# Patient Record
Sex: Female | Born: 1950 | Race: White | Hispanic: No | Marital: Married | Smoking: Former smoker
Health system: Southern US, Community
[De-identification: ages and names within clinical notes are randomized; demographics above are authoritative.]

## PROBLEM LIST (undated history)

## (undated) DIAGNOSIS — R51 Headache: Secondary | ICD-10-CM

## (undated) DIAGNOSIS — R519 Headache, unspecified: Secondary | ICD-10-CM

## (undated) DIAGNOSIS — J449 Chronic obstructive pulmonary disease, unspecified: Secondary | ICD-10-CM

## (undated) HISTORY — PX: TUBAL LIGATION: SHX77

## (undated) HISTORY — PX: CHOLECYSTECTOMY: SHX55

---

## 1997-09-10 ENCOUNTER — Emergency Department (HOSPITAL_COMMUNITY): Admission: EM | Admit: 1997-09-10 | Discharge: 1997-09-10 | Payer: Self-pay | Admitting: Emergency Medicine

## 1998-07-28 ENCOUNTER — Encounter: Payer: Self-pay | Admitting: Obstetrics and Gynecology

## 1998-07-28 ENCOUNTER — Ambulatory Visit (HOSPITAL_COMMUNITY): Admission: RE | Admit: 1998-07-28 | Discharge: 1998-07-28 | Payer: Self-pay | Admitting: *Deleted

## 2004-03-04 ENCOUNTER — Ambulatory Visit (HOSPITAL_COMMUNITY): Admission: RE | Admit: 2004-03-04 | Discharge: 2004-03-04 | Payer: Self-pay | Admitting: Obstetrics and Gynecology

## 2004-09-22 ENCOUNTER — Emergency Department (HOSPITAL_COMMUNITY): Admission: EM | Admit: 2004-09-22 | Discharge: 2004-09-22 | Payer: Self-pay | Admitting: Family Medicine

## 2005-05-11 ENCOUNTER — Ambulatory Visit (HOSPITAL_COMMUNITY): Admission: RE | Admit: 2005-05-11 | Discharge: 2005-05-11 | Payer: Self-pay | Admitting: Obstetrics and Gynecology

## 2005-07-31 ENCOUNTER — Ambulatory Visit (HOSPITAL_COMMUNITY): Admission: RE | Admit: 2005-07-31 | Discharge: 2005-07-31 | Payer: Self-pay | Admitting: Cardiology

## 2005-08-14 ENCOUNTER — Encounter: Admission: RE | Admit: 2005-08-14 | Discharge: 2005-08-14 | Payer: Self-pay | Admitting: Cardiology

## 2005-12-21 ENCOUNTER — Encounter: Admission: RE | Admit: 2005-12-21 | Discharge: 2005-12-21 | Payer: Self-pay | Admitting: Family Medicine

## 2009-02-03 ENCOUNTER — Ambulatory Visit (HOSPITAL_COMMUNITY): Admission: RE | Admit: 2009-02-03 | Discharge: 2009-02-03 | Payer: Self-pay | Admitting: Obstetrics and Gynecology

## 2010-09-16 NOTE — Cardiovascular Report (Signed)
[name], [name]                 ACCOUNT NO.:  [id number]   MEDICAL RECORD NO.:  [id number]          PATIENT TYPE:  OIB   LOCATION:  2899                         FACILITY:  [facility]   PHYSICIAN:  Armanda Magic, M.D.     DATE OF BIRTH:  1950-12-13   DATE OF PROCEDURE:  DATE OF DISCHARGE:  07/31/2005                              CARDIAC CATHETERIZATION   REFERRING PHYSICIAN:  Dr. Lupe Carney.   PROCEDURES:  Left heart catheterization, coronary angiography, left  ventriculography.   OPERATOR:  Armanda Magic, M.D.   COMPLICATIONS:  None.   IV ACCESS:  The right femoral artery 6-French sheath.   INDICATIONS:  Chest pain.   This is a very pleasant 60 year old white female who presents with symptoms  of chest pain.  She does have a history of significant tobacco abuse and now  presents for cardiac catheterization, after undergoing a stress Cardiolite  study which showed some decreased uptake in the anterior wall and a fixed  apical defect, also some decreased perfusion in basal septum, more prominent  on stress than on rest images.   The patient was brought to cardiac catheterization laboratory in a fasting,  non-sedated state.  Informed consent was obtained.  The patient was  connected to continuous heart rate and pulse oximetry monitoring and  intermittent blood pressure monitor.  The right groin was prepped and draped  in a sterile fashion.  1% Xylocaine was used for local anesthesia.  Using  the modified Seldinger technique, a 6-French sheath was placed in right  femoral artery.  Under fluoroscopic guidance, a 6-French JL-4 catheter was  placed in the left coronary artery.  Multiple cine films were taken at the  30 degree RAO and 40 degree LAO views.  This catheter was then exchanged out  of her guidewire for a 6-French JR-4 catheter which was placed under  fluoroscopic guidance in the right coronary artery.  Multiple cine films  were taken at 30 degree RAO and 40 degree LAO  views.  This catheter was then  exchanged out of the guidewire for a 6-French angled pigtail catheter, which  was placed under fluoroscopic guidance to the left ventricular cavity.  Left  ventriculography was performed in the 30 degree RAO view using a total of 30  cc of contrast at 15 cc per second.  Catheter was then pulled back across  the aortic valve with no significant gradient noted.  At the end of the  procedure, all catheters and sheaths were removed.  Manual compression was  performed until adequate hemostasis was obtained.  The patient was  transferred back to her room in stable condition.   RESULTS:  1.  The left main coronary is widely patent and bifurcates in the left      anterior descending artery and left circumflex artery.  The left      anterior descending artery is widely patent throughout its course with      some mild systolic compression of the mid LAD.  The LAD gives off a      large diagonal branch which is widely  patent and bifurcates distally      into 2 daughter branches, both of which are widely patent.   The left circumflex is widely patent throughout its course giving rise to 5  obtuse marginal branches, all of which are widely patent.   The right coronary is widely patent and bifurcates into a posterior  descending artery and posterior lateral artery, both of which are widely  patent.   Left ventriculography shows a low normal LV systolic function, EF 50%, LV  pressure 131/10 mmHg, aortic pressure 132/70 mmHg, LVEDP 13 mmHg.   ASSESSMENT:  1.  Noncardiac chest pain.  2.  Normal coronary arteries.  3.  Low normal left ventricular function.   PLAN:  Discharge to home after IV fluid and bed rest.  Follow up with me in  2 weeks.  Outpatient chest CT with contrast to rule out PEs, and Nexium 40  mg a day.      Armanda Magic, M.D.  Electronically Signed     TT/MEDQ  D:  08/14/2005  T:  08/15/2005  Job:  [id number]   cc:   L. Lupe Carney, M.D.   Fax: [phone]

## 2010-09-16 NOTE — Cardiovascular Report (Signed)
[name], [name]                 ACCOUNT NO.:  [id number]   MEDICAL RECORD NO.:  [id number]          PATIENT TYPE:  OIB   LOCATION:  2899                         FACILITY:  [facility]   PHYSICIAN:  Armanda Magic, M.D.     DATE OF BIRTH:  [birth year]   DATE OF PROCEDURE:  07/31/2005  DATE OF DISCHARGE:  07/31/2005                              CARDIAC CATHETERIZATION   REFERRING PHYSICIAN:  L. Lupe Carney, M.D.   PROCEDURE:  Left heart catheterization, coronary angiography, left  ventriculography.   SURGEON:  Armanda Magic, M.D.   COMPLICATIONS:  None.   IV ACCESS:  Via right femoral artery 6-French sheath, __________.   This is a 60 year old white female with occasional chest pain who now  presents for cardiac catheterization.   The patient is brought to cardiac catheterization laboratory in fasting  nonsedated state.  Informed consent was obtained.  The patient was connected  to continuous heart rate and pulse oximetry monitoring and blood pressure  monitoring.  The right groin was prepped and draped in sterile fashion.  We  used 1% Xylocaine for local anesthesia.  Using modified Seldinger technique,  a 6-French sheath was placed in right femoral artery.  Under fluoroscopic  guidance, a 6-French JL-4 catheter was placed in the left coronary artery.  Multiple cine films were taken at 30 degree RAO and 40 LAO views.  This  catheter was exchanged out over a guidewire for a 6-French JR-4 catheter  which was placed under fluoroscopic guidance in the right coronary artery.  Multiple cine films were taken at 30 degree RAO and 40 degree LAO views.  This catheter was exchanged out over a guidewire for 6-French angled pigtail  catheter which was placed under fluoroscopic guidance in the left  ventricular cavity.  Left ventriculography was performed in the 30 degree  RAO view using total of 30 mL of contrast at 15 mL per second.  Catheter was  then pulled back across the aortic valve with no  significant gradient noted.  At the end of the procedure, all catheters and sheaths were removed.  Manual  compression was performed until adequate hemostasis was obtained.  The  patient transferred back to the room in stable condition.   RESULTS:  The left main coronary artery is widely patent and bifurcates into  left anterior descending artery and left circumflex artery.   Left anterior descending artery is widely patent throughout its course to  the apex with mild systolic compression in the mid portion of the vessel.  It gives rise to first diagonal branch which is widely patent and bifurcates  into daughter branches both of which are widely patent.   The left circumflex is widely patent throughout its course in the AV groove,  giving rise to five obtuse marginal branches all of which are widely patent.   The right coronary artery is widely patent throughout its course and  distally bifurcates into posterior descending artery and posterior lateral  artery both of which are widely patent.   Left ventriculography shows low normal LV systolic function  EF 50%, left  ventricular pressure 131/10 mmHg, aortic pressure 132/70 mmHg, LVEDP 13  mmHg.   ASSESSMENT:  1.  Noncardiac chest pain.  2.  Normal coronary arteries.  3.  Low normal left ventricular function.   PLAN:  Discharge to home after IV fluid, bedrest.  Follow up with me in 2  weeks.  Outpatient chest CT with contrast to rule out PE, GI workup as an  outpatient.  Nexium 40 mg a day.      Armanda Magic, M.D.  Electronically Signed     TT/MEDQ  D:  08/31/2005  T:  09/01/2005  Job:  [id number]   cc:   L. Lupe Carney, M.D.  Fax: [phone]

## 2012-01-17 ENCOUNTER — Other Ambulatory Visit (HOSPITAL_COMMUNITY): Payer: Self-pay | Admitting: Obstetrics and Gynecology

## 2012-01-17 DIAGNOSIS — Z1231 Encounter for screening mammogram for malignant neoplasm of breast: Secondary | ICD-10-CM

## 2012-01-22 ENCOUNTER — Ambulatory Visit (HOSPITAL_COMMUNITY)
Admission: RE | Admit: 2012-01-22 | Discharge: 2012-01-22 | Disposition: A | Discharge status: Home / Self Care | Payer: 59 | Source: Ambulatory Visit | Attending: Obstetrics and Gynecology | Admitting: Obstetrics and Gynecology

## 2012-01-22 DIAGNOSIS — Z1231 Encounter for screening mammogram for malignant neoplasm of breast: Secondary | ICD-10-CM | POA: Insufficient documentation

## 2012-05-10 ENCOUNTER — Other Ambulatory Visit: Payer: Self-pay | Admitting: Family Medicine

## 2012-05-10 ENCOUNTER — Other Ambulatory Visit (HOSPITAL_COMMUNITY)
Admission: RE | Admit: 2012-05-10 | Discharge: 2012-05-10 | Disposition: A | Discharge status: Home / Self Care | Payer: 59 | Source: Ambulatory Visit | Attending: Family Medicine | Admitting: Family Medicine

## 2012-05-10 DIAGNOSIS — Z124 Encounter for screening for malignant neoplasm of cervix: Secondary | ICD-10-CM | POA: Insufficient documentation

## 2012-06-27 ENCOUNTER — Other Ambulatory Visit: Payer: Self-pay | Admitting: Gastroenterology

## 2014-08-21 ENCOUNTER — Other Ambulatory Visit: Payer: Self-pay | Admitting: Family Medicine

## 2014-08-21 ENCOUNTER — Other Ambulatory Visit (HOSPITAL_COMMUNITY): Payer: Self-pay | Admitting: Radiology

## 2014-08-21 ENCOUNTER — Ambulatory Visit
Admission: RE | Admit: 2014-08-21 | Discharge: 2014-08-21 | Disposition: A | Discharge status: Home / Self Care | Payer: Medicare Other | Source: Ambulatory Visit | Attending: Family Medicine | Admitting: Family Medicine

## 2014-08-21 DIAGNOSIS — J439 Emphysema, unspecified: Secondary | ICD-10-CM

## 2014-08-21 DIAGNOSIS — J449 Chronic obstructive pulmonary disease, unspecified: Secondary | ICD-10-CM

## 2014-09-01 ENCOUNTER — Ambulatory Visit (HOSPITAL_COMMUNITY)
Admission: RE | Admit: 2014-09-01 | Discharge: 2014-09-01 | Disposition: A | Discharge status: Home / Self Care | Payer: Medicare Other | Source: Ambulatory Visit | Attending: Family Medicine | Admitting: Family Medicine

## 2014-09-01 DIAGNOSIS — J449 Chronic obstructive pulmonary disease, unspecified: Secondary | ICD-10-CM | POA: Diagnosis present

## 2014-09-01 LAB — PULMONARY FUNCTION TEST
DL/VA % PRED: 68 %
DL/VA: 3.35 ml/min/mmHg/L
DLCO UNC % PRED: 25 %
DLCO UNC: 6.62 ml/min/mmHg
FEF 25-75 POST: 0.62 L/s
FEF 25-75 PRE: 0.49 L/s
FEF2575-%Change-Post: 25 %
FEF2575-%PRED-PRE: 22 %
FEF2575-%Pred-Post: 28 %
FEV1-%Change-Post: 10 %
FEV1-%PRED-PRE: 43 %
FEV1-%Pred-Post: 47 %
FEV1-POST: 1.19 L
FEV1-Pre: 1.08 L
FEV1FVC-%CHANGE-POST: -2 %
FEV1FVC-%Pred-Pre: 69 %
FEV6-%CHANGE-POST: 4 %
FEV6-%PRED-PRE: 62 %
FEV6-%Pred-Post: 65 %
FEV6-Post: 2.06 L
FEV6-Pre: 1.97 L
FEV6FVC-%Change-Post: -7 %
FEV6FVC-%PRED-POST: 92 %
FEV6FVC-%Pred-Pre: 100 %
FVC-%CHANGE-POST: 13 %
FVC-%PRED-POST: 70 %
FVC-%Pred-Pre: 62 %
FVC-Post: 2.31 L
FVC-Pre: 2.04 L
POST FEV1/FVC RATIO: 52 %
PRE FEV1/FVC RATIO: 53 %
Post FEV6/FVC ratio: 89 %
Pre FEV6/FVC Ratio: 97 %
RV % pred: 195 %
RV: 4.2 L
TLC % pred: 122 %
TLC: 6.38 L

## 2014-09-01 MED ORDER — ALBUTEROL SULFATE (2.5 MG/3ML) 0.083% IN NEBU
2.5000 mg | INHALATION_SOLUTION | Freq: Once | RESPIRATORY_TRACT | Status: AC
  Administered 2014-09-01: 2.5 mg via RESPIRATORY_TRACT

## 2015-08-27 DIAGNOSIS — Z0000 Encounter for general adult medical examination without abnormal findings: Secondary | ICD-10-CM | POA: Diagnosis not present

## 2015-08-27 DIAGNOSIS — J449 Chronic obstructive pulmonary disease, unspecified: Secondary | ICD-10-CM | POA: Diagnosis not present

## 2015-08-27 DIAGNOSIS — E7800 Pure hypercholesterolemia, unspecified: Secondary | ICD-10-CM | POA: Diagnosis not present

## 2015-08-27 DIAGNOSIS — M8588 Other specified disorders of bone density and structure, other site: Secondary | ICD-10-CM | POA: Diagnosis not present

## 2015-10-25 IMAGING — CR DG CHEST 2V
2 series · 2 of 2 positions shown · non-contrast
Comparison: 01/31/2012

CLINICAL DATA: Pulmonary emphysema. Cough and congestion for
several weeks.

EXAM:
CHEST  2 VIEW

[w chest pa]
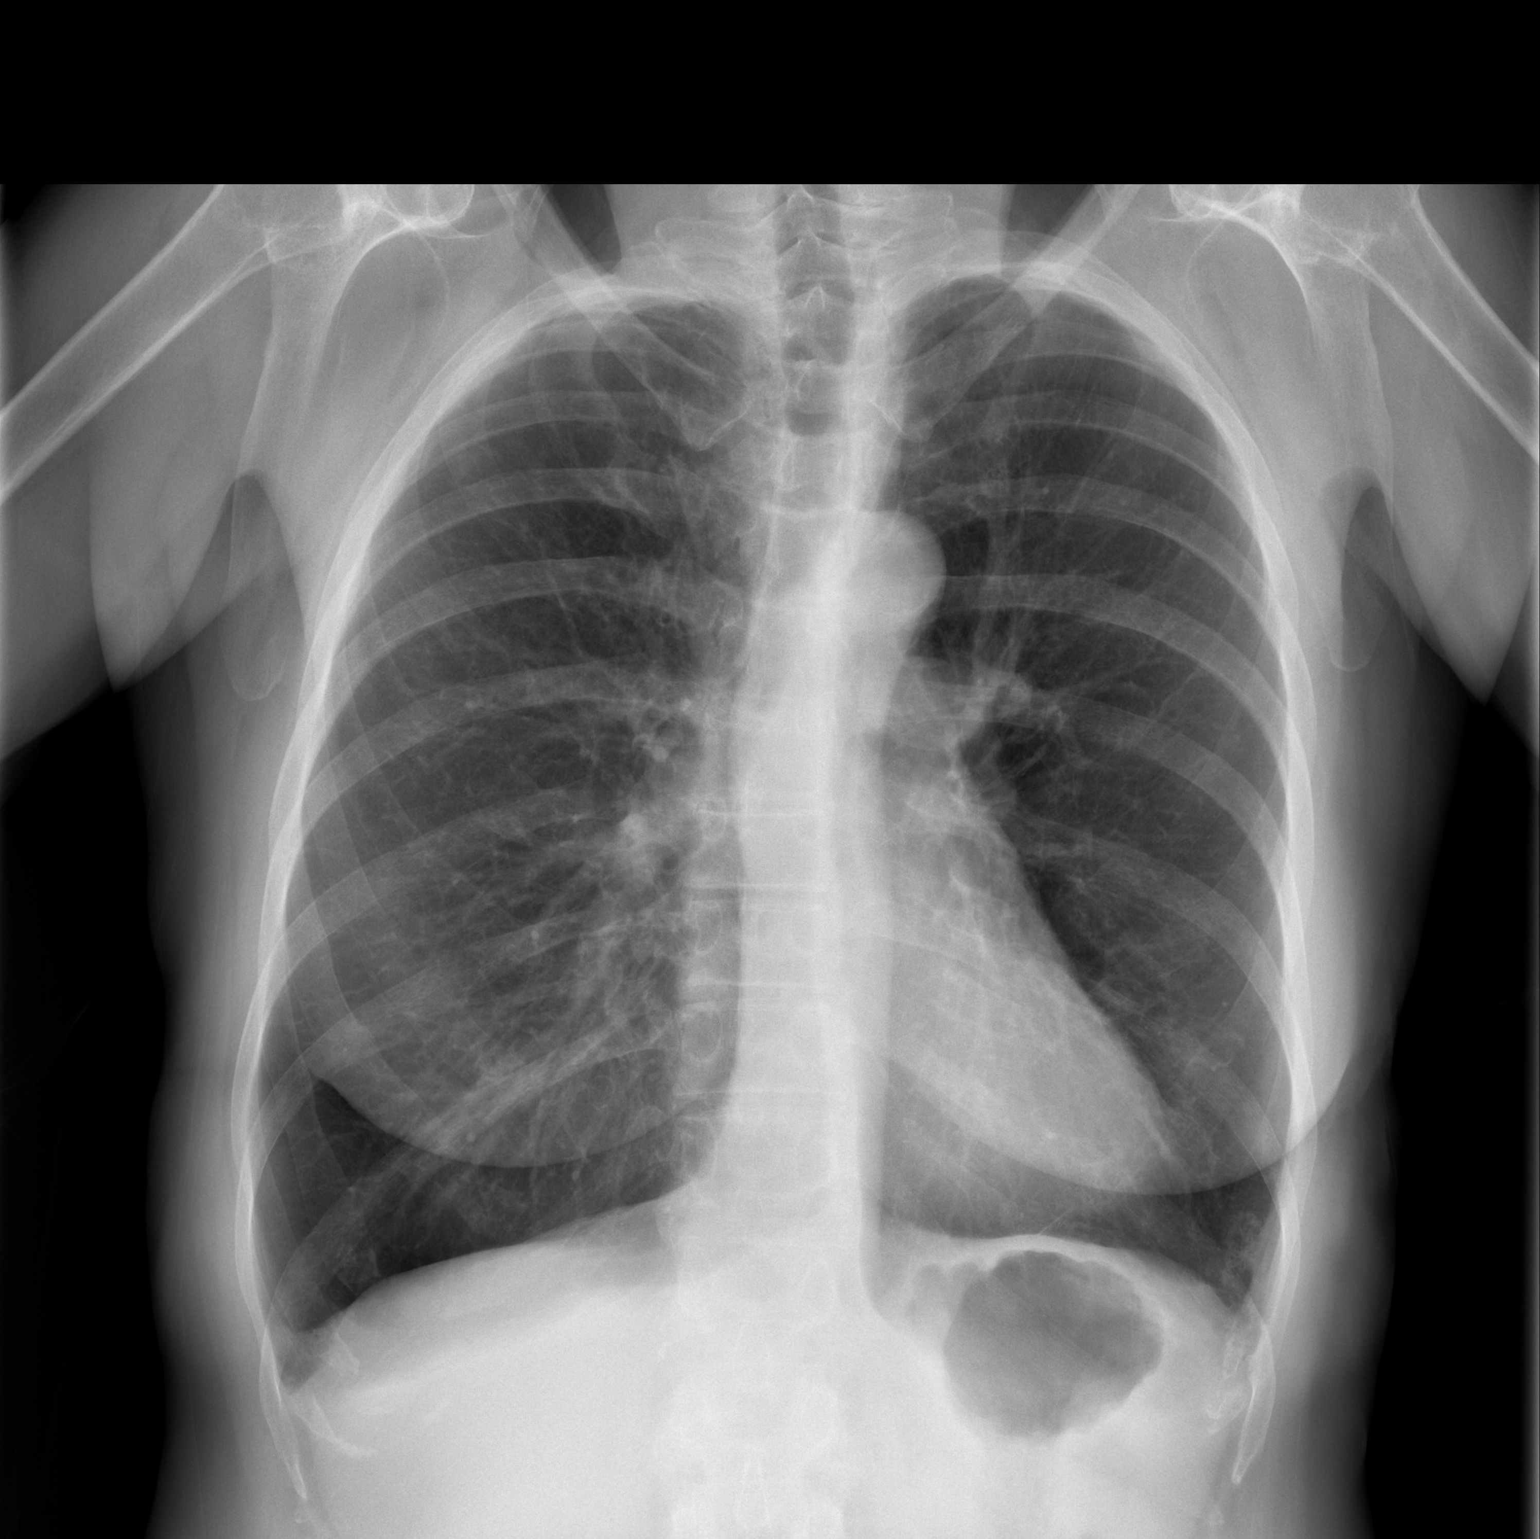

[w chest lat]
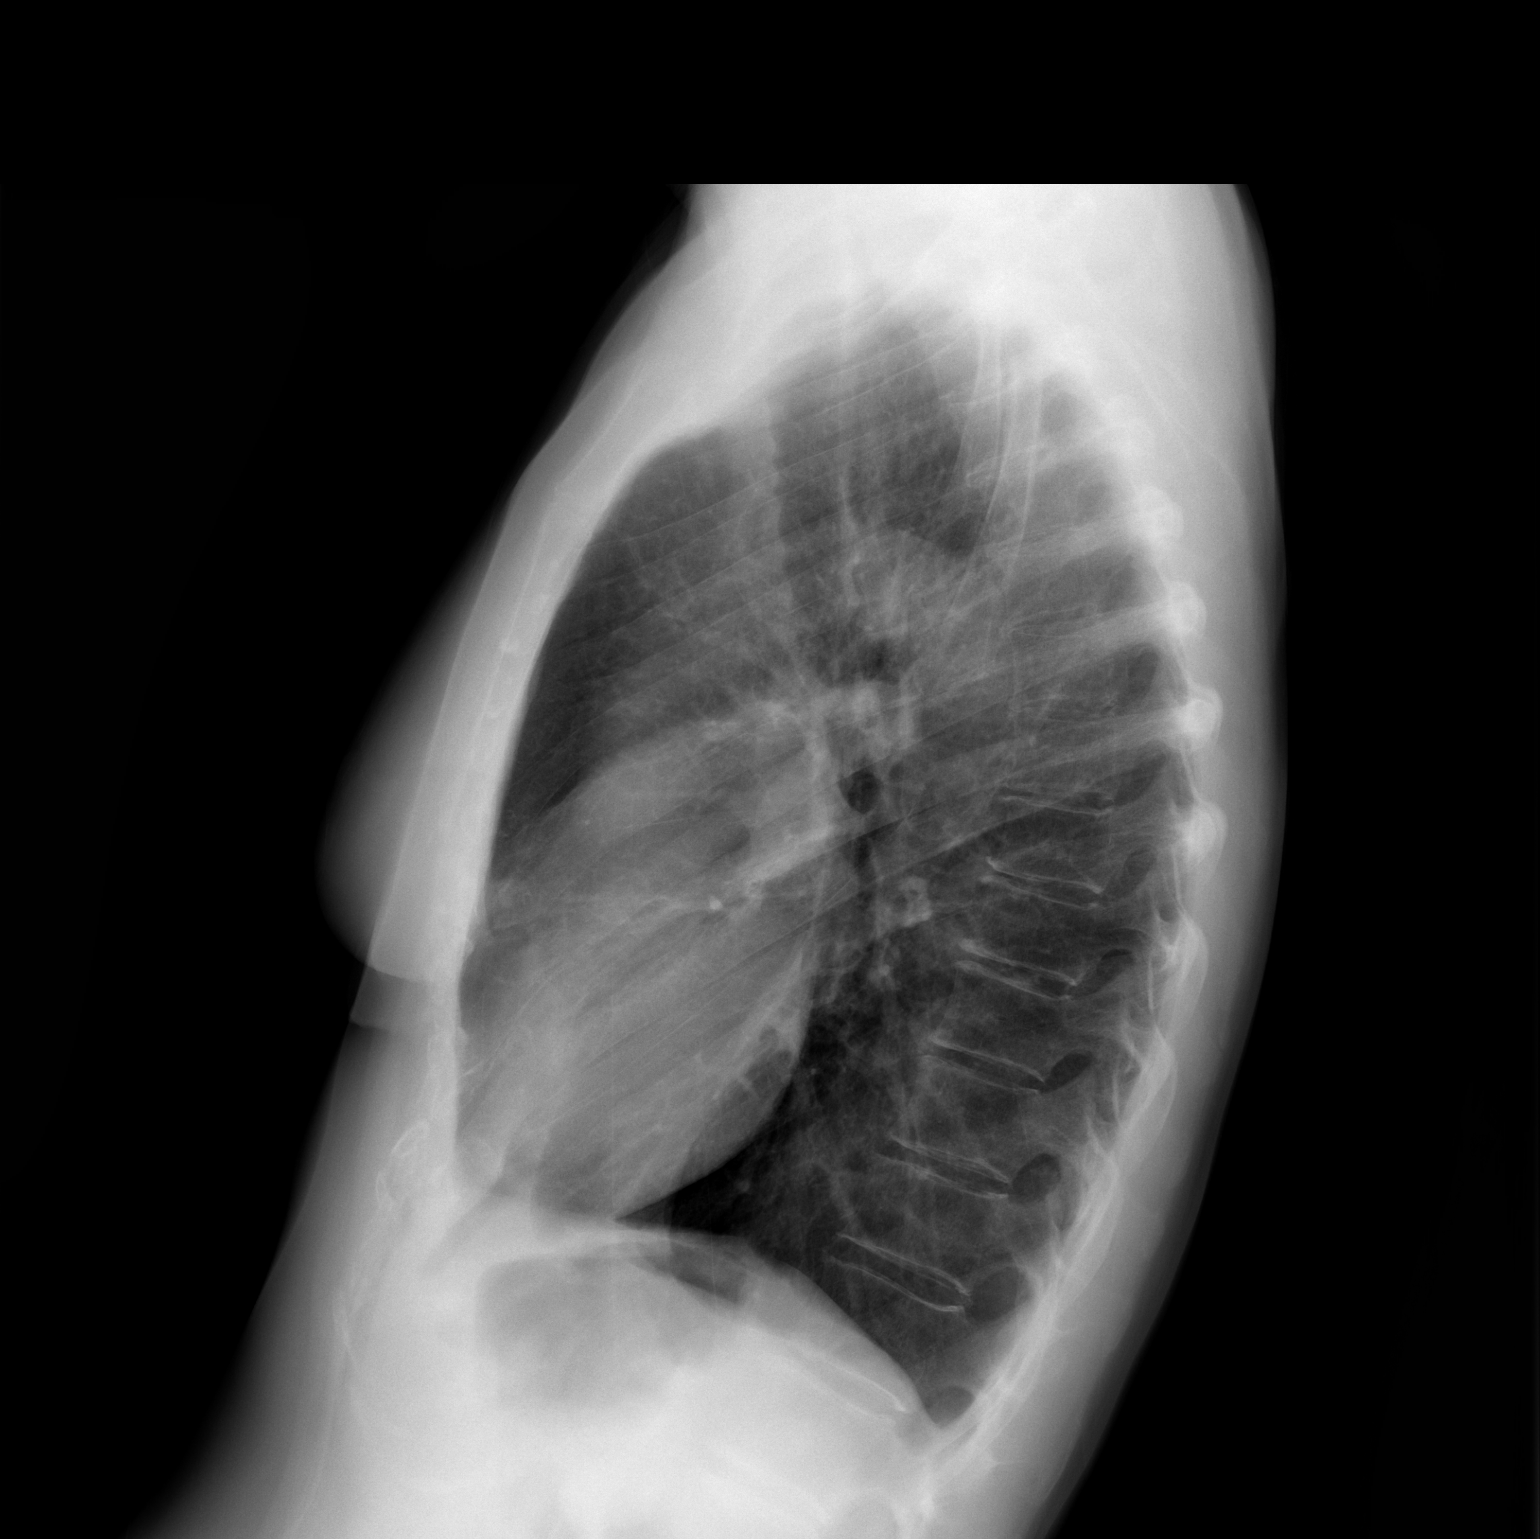

[2 of 2 positions shown; findings below may reference images not displayed]

FINDINGS: Lungs are hyperinflated with coarsened interstitial markings
bilaterally. Mild diffuse bronchial wall thickening noted. No
airspace consolidation. Heart size is normal. No pleural effusion or
edema.
IMPRESSION: 1. No acute cardiopulmonary abnormalities.
2. Chronic changes consistent with emphysema.

## 2016-02-10 DIAGNOSIS — Z23 Encounter for immunization: Secondary | ICD-10-CM | POA: Diagnosis not present

## 2016-03-10 DIAGNOSIS — Z23 Encounter for immunization: Secondary | ICD-10-CM | POA: Diagnosis not present

## 2016-05-11 DIAGNOSIS — J449 Chronic obstructive pulmonary disease, unspecified: Secondary | ICD-10-CM | POA: Diagnosis not present

## 2016-05-11 DIAGNOSIS — J069 Acute upper respiratory infection, unspecified: Secondary | ICD-10-CM | POA: Diagnosis not present

## 2016-05-11 DIAGNOSIS — R05 Cough: Secondary | ICD-10-CM | POA: Diagnosis not present

## 2016-10-25 DIAGNOSIS — Z0000 Encounter for general adult medical examination without abnormal findings: Secondary | ICD-10-CM | POA: Diagnosis not present

## 2016-10-25 DIAGNOSIS — J449 Chronic obstructive pulmonary disease, unspecified: Secondary | ICD-10-CM | POA: Diagnosis not present

## 2016-10-25 DIAGNOSIS — E7800 Pure hypercholesterolemia, unspecified: Secondary | ICD-10-CM | POA: Diagnosis not present

## 2016-10-25 DIAGNOSIS — M8588 Other specified disorders of bone density and structure, other site: Secondary | ICD-10-CM | POA: Diagnosis not present

## 2016-10-25 DIAGNOSIS — Z1159 Encounter for screening for other viral diseases: Secondary | ICD-10-CM | POA: Diagnosis not present

## 2017-03-02 DIAGNOSIS — Z23 Encounter for immunization: Secondary | ICD-10-CM | POA: Diagnosis not present

## 2017-08-14 DIAGNOSIS — Z1231 Encounter for screening mammogram for malignant neoplasm of breast: Secondary | ICD-10-CM | POA: Diagnosis not present

## 2017-08-30 ENCOUNTER — Other Ambulatory Visit: Payer: Self-pay | Admitting: Family Medicine

## 2017-08-30 DIAGNOSIS — R1013 Epigastric pain: Secondary | ICD-10-CM | POA: Diagnosis not present

## 2017-08-31 ENCOUNTER — Ambulatory Visit
Admission: RE | Admit: 2017-08-31 | Discharge: 2017-08-31 | Disposition: A | Discharge status: Home / Self Care | Payer: Medicare Other | Source: Ambulatory Visit | Attending: Family Medicine | Admitting: Family Medicine

## 2017-08-31 DIAGNOSIS — K8020 Calculus of gallbladder without cholecystitis without obstruction: Secondary | ICD-10-CM | POA: Diagnosis not present

## 2017-08-31 DIAGNOSIS — R1013 Epigastric pain: Secondary | ICD-10-CM

## 2017-09-05 ENCOUNTER — Ambulatory Visit: Payer: Self-pay | Admitting: Surgery

## 2017-09-05 DIAGNOSIS — K819 Cholecystitis, unspecified: Secondary | ICD-10-CM | POA: Diagnosis not present

## 2017-09-05 NOTE — H&P (Signed)
[name] Documented: 09/05/2017 3:31 PM Location: [facility] Patient #: [id number] DOB: 1951/04/09 Married / Language: English / Race: White Female  History of Present Illness (Chelsea A. Fredricka Bonine MD; 09/05/2017 3:55 PM) Patient words: This is a very pleasant 67 year old woman with abdominal pain. She has had intermittent abdominal pain for quite some time, she thinks at least several months, however in the last couple of weeks she has had constant pain, associated with occasional dry heaves or vomiting of partially digested food. No fevers that she is aware of. She presented to her primary care doctor on May 2 for evaluation. She was given prescriptions for Lortab and pantoprazole she states that the Lortab else with the PPI does not. Her labs that day showed a lipase of 24, creatinine of 0.68, CO2 27, total bilirubin 0.4, alkaline phosphatase, AST ALT and albumin normal, amylase 35, white count 8.8, hemoglobin 14.2. She had an ultrasound done on May 3 which shows multiple gallstones, diffuse prominent gallbladder wall thickening up to 10 mm, no pericholecystic fluid or sonographic Murphy sign, common bile duct 3 mm, no other abnormalities. She has a history of COPD but no other medical problems.  She does still smoke a half-pack a day. She has had a tubal ligation, no other abdominal surgeries.  The patient is a 67 year old female.   Past Surgical History Sander Nephew, CMA; 09/05/2017 3:32 PM) Colon Polyp Removal - Colonoscopy  Diagnostic Studies History Sander Nephew, CMA; 09/05/2017 3:32 PM) Colonoscopy 1-5 years ago Mammogram within last year Pap Smear 1-5 years ago  Allergies Sander Nephew, CMA; 09/05/2017 3:32 PM) No Known Allergies [09/05/2017]: Allergies Reconciled  Medication History Sander Nephew, CMA; 09/05/2017 3:34 PM) Symbicort (Inhalation) Specific strength unknown - Active. Pantoprazole Sodium (Oral) Specific strength unknown -  Active. Hydrocodone-Acetaminophen (Oral) Specific strength unknown - Active. Flexall (External) Specific strength unknown - Active. Calcium (Oral) Specific strength unknown - Active. Aspirin (  Tablet DR, Oral) Active. Vitamin C (Oral) Specific strength unknown - Active. Multiple Vitamin (Oral) Active. Medications Reconciled  Social History Sander Nephew, CMA; 09/05/2017 3:32 PM) Alcohol use Occasional alcohol use. Caffeine use Carbonated beverages, Coffee, Tea. No drug use Tobacco use Current every day smoker.  Family History Sander Nephew, CMA; 09/05/2017 3:32 PM) First Degree Relatives No pertinent family history  Pregnancy / Birth History Sander Nephew, CMA; 09/05/2017 3:32 PM) Age at menarche 9 years. Age of menopause 73-50 Gravida 4 Irregular periods Maternal age 33-20 Para 4  Other Problems Sander Nephew, CMA; 09/05/2017 3:32 PM) Bladder Problems Chronic Obstructive Lung Disease     Review of Systems Duwayne Heck Gerrigner CMA; 09/05/2017 3:32 PM) General Not Present- Appetite Loss, Chills, Fatigue, Fever, Night Sweats, Weight Gain and Weight Loss. Skin Not Present- Change in Wart/Mole, Dryness, Hives, Jaundice, New Lesions, Non-Healing Wounds, Rash and Ulcer. HEENT Present- Wears glasses/contact lenses. Not Present- Earache, Hearing Loss, Hoarseness, Nose Bleed, Oral Ulcers, Ringing in the Ears, Seasonal Allergies, Sinus Pain, Sore Throat, Visual Disturbances and Yellow Eyes. Respiratory Not Present- Bloody sputum, Chronic Cough, Difficulty Breathing, Snoring and Wheezing. Breast Not Present- Breast Mass, Breast Pain, Nipple Discharge and Skin Changes. Cardiovascular Present- Shortness of Breath. Not Present- Chest Pain, Difficulty Breathing Lying Down, Leg Cramps, Palpitations, Rapid Heart Rate and Swelling of Extremities. Neurological Not Present- Decreased Memory, Fainting, Headaches, Numbness, Seizures, Tingling, Tremor, Trouble  walking and Weakness. Psychiatric Not Present- Anxiety, Bipolar, Change in Sleep Pattern, Depression, Fearful and Frequent crying. Endocrine Not Present- Cold Intolerance, Excessive Hunger, Hair Changes, Heat  Intolerance, Hot flashes and New Diabetes. Hematology Present- Easy Bruising. Not Present- Blood Thinners, Excessive bleeding, Gland problems, HIV and Persistent Infections.  Vitals (Danielle Gerrigner CMA; 09/05/2017 3:35 PM) 09/05/2017 3:34 PM Weight: 134.38 lb Height: 65in Body Surface Area: 1.67 m Body Mass Index: 22.36 kg/m  Temp.: 98.3F(Oral)  Pulse: 56 (Regular)  BP: 132/74 (Sitting, Left Arm, Standard)      Physical Exam (Chelsea A. Connor MD; 09/05/2017 3:55 PM)  The physical exam findings are as follows: Note:Gen: alert and well appearing Eye: extraocular motion intact, no scleral icterus ENT: moist mucus membranes, dentures in Neck: no mass or thyromegaly Chest: unlabored respirations, symmetrical air entry, clear bilaterally CV: regular rate and rhythm, no pedal edema Abdomen: soft, mildly tender in epigastrium more so than periumbilical and lower fields, not really tender in right subcostal margin, nondistended. No organomegaly MSK: strength symmetrical throughout, no deformity Neuro: grossly intact, normal gait Psych: normal mood and affect, appropriate insight Skin: warm and dry, no rash or lesion on limited exam has a couple small lipomas in the abdominal wall    Assessment & Plan (Chelsea A. Connor MD; 09/05/2017 3:57 PM)  CHOLECYSTITIS (K81.9) Story: Subacute on chronic given ultrasound findings. I recommended proceeding with laparoscopic cholecystectomy. I discussed the technique of the surgery with her and the risks of bleeding, infection, pain, scarring, intra-abdominal injury specifically to the common bile duct and sequelae, conversion to open surgery, general risks of blood clot, pneumonia, heart attack, stroke etc., risk of conversion to open  surgery and failure to resolve symptoms. Questions were welcomed and answered. We will get her scheduled in the next few days. 

## 2017-09-05 NOTE — H&P (View-Only) (Signed)
[name] Documented: 09/05/2017 3:31 PM Location: [facility] Patient #: [id number] DOB: 1951/04/09 Married / Language: English / Race: White Female  History of Present Illness (Savir Blanke A. Fredricka Bonine MD; 09/05/2017 3:55 PM) Patient words: This is a very pleasant 67 year old woman with abdominal pain. She has had intermittent abdominal pain for quite some time, she thinks at least several months, however in the last couple of weeks she has had constant pain, associated with occasional dry heaves or vomiting of partially digested food. No fevers that she is aware of. She presented to her primary care doctor on May 2 for evaluation. She was given prescriptions for Lortab and pantoprazole she states that the Lortab else with the PPI does not. Her labs that day showed a lipase of 24, creatinine of 0.68, CO2 27, total bilirubin 0.4, alkaline phosphatase, AST ALT and albumin normal, amylase 35, white count 8.8, hemoglobin 14.2. She had an ultrasound done on May 3 which shows multiple gallstones, diffuse prominent gallbladder wall thickening up to 10 mm, no pericholecystic fluid or sonographic Murphy sign, common bile duct 3 mm, no other abnormalities. She has a history of COPD but no other medical problems.  She does still smoke a half-pack a day. She has had a tubal ligation, no other abdominal surgeries.  The patient is a 67 year old female.   Past Surgical History Sander Nephew, CMA; 09/05/2017 3:32 PM) Colon Polyp Removal - Colonoscopy  Diagnostic Studies History Sander Nephew, CMA; 09/05/2017 3:32 PM) Colonoscopy 1-5 years ago Mammogram within last year Pap Smear 1-5 years ago  Allergies Sander Nephew, CMA; 09/05/2017 3:32 PM) No Known Allergies [09/05/2017]: Allergies Reconciled  Medication History Sander Nephew, CMA; 09/05/2017 3:34 PM) Symbicort (Inhalation) Specific strength unknown - Active. Pantoprazole Sodium (Oral) Specific strength unknown -  Active. Hydrocodone-Acetaminophen (Oral) Specific strength unknown - Active. Flexall (External) Specific strength unknown - Active. Calcium (Oral) Specific strength unknown - Active. Aspirin (  Tablet DR, Oral) Active. Vitamin C (Oral) Specific strength unknown - Active. Multiple Vitamin (Oral) Active. Medications Reconciled  Social History Sander Nephew, CMA; 09/05/2017 3:32 PM) Alcohol use Occasional alcohol use. Caffeine use Carbonated beverages, Coffee, Tea. No drug use Tobacco use Current every day smoker.  Family History Sander Nephew, CMA; 09/05/2017 3:32 PM) First Degree Relatives No pertinent family history  Pregnancy / Birth History Sander Nephew, CMA; 09/05/2017 3:32 PM) Age at menarche 9 years. Age of menopause 73-50 Gravida 4 Irregular periods Maternal age 33-20 Para 4  Other Problems Sander Nephew, CMA; 09/05/2017 3:32 PM) Bladder Problems Chronic Obstructive Lung Disease     Review of Systems Duwayne Heck Gerrigner CMA; 09/05/2017 3:32 PM) General Not Present- Appetite Loss, Chills, Fatigue, Fever, Night Sweats, Weight Gain and Weight Loss. Skin Not Present- Change in Wart/Mole, Dryness, Hives, Jaundice, New Lesions, Non-Healing Wounds, Rash and Ulcer. HEENT Present- Wears glasses/contact lenses. Not Present- Earache, Hearing Loss, Hoarseness, Nose Bleed, Oral Ulcers, Ringing in the Ears, Seasonal Allergies, Sinus Pain, Sore Throat, Visual Disturbances and Yellow Eyes. Respiratory Not Present- Bloody sputum, Chronic Cough, Difficulty Breathing, Snoring and Wheezing. Breast Not Present- Breast Mass, Breast Pain, Nipple Discharge and Skin Changes. Cardiovascular Present- Shortness of Breath. Not Present- Chest Pain, Difficulty Breathing Lying Down, Leg Cramps, Palpitations, Rapid Heart Rate and Swelling of Extremities. Neurological Not Present- Decreased Memory, Fainting, Headaches, Numbness, Seizures, Tingling, Tremor, Trouble  walking and Weakness. Psychiatric Not Present- Anxiety, Bipolar, Change in Sleep Pattern, Depression, Fearful and Frequent crying. Endocrine Not Present- Cold Intolerance, Excessive Hunger, Hair Changes, Heat  Intolerance, Hot flashes and New Diabetes. Hematology Present- Easy Bruising. Not Present- Blood Thinners, Excessive bleeding, Gland problems, HIV and Persistent Infections.  Vitals (Danielle Gerrigner CMA; 09/05/2017 3:35 PM) 09/05/2017 3:34 PM Weight: 134.38 lb Height: 65in Body Surface Area: 1.67 m Body Mass Index: 22.36 kg/m  Temp.: 98.82F(Oral)  Pulse: 56 (Regular)  BP: 132/74 (Sitting, Left Arm, Standard)      Physical Exam (Cheyenne Bordeaux A. Fredricka Bonine MD; 09/05/2017 3:55 PM)  The physical exam findings are as follows: Note:Gen: alert and well appearing Eye: extraocular motion intact, no scleral icterus ENT: moist mucus membranes, dentures in Neck: no mass or thyromegaly Chest: unlabored respirations, symmetrical air entry, clear bilaterally CV: regular rate and rhythm, no pedal edema Abdomen: soft, mildly tender in epigastrium more so than periumbilical and lower fields, not really tender in right subcostal margin, nondistended. No organomegaly MSK: strength symmetrical throughout, no deformity Neuro: grossly intact, normal gait Psych: normal mood and affect, appropriate insight Skin: warm and dry, no rash or lesion on limited exam has a couple small lipomas in the abdominal wall    Assessment & Plan (Berdina Cheever A. Fredricka Bonine MD; 09/05/2017 3:57 PM)  CHOLECYSTITIS (K81.9) Story: Subacute on chronic given ultrasound findings. I recommended proceeding with laparoscopic cholecystectomy. I discussed the technique of the surgery with her and the risks of bleeding, infection, pain, scarring, intra-abdominal injury specifically to the common bile duct and sequelae, conversion to open surgery, general risks of blood clot, pneumonia, heart attack, stroke etc., risk of conversion to open  surgery and failure to resolve symptoms. Questions were welcomed and answered. We will get her scheduled in the next few days.

## 2017-09-06 NOTE — Patient Instructions (Addendum)
[name]  09/06/2017   Your procedure is scheduled on: 09-10-17  Report to [facility]  Entrance              Report to admitting at     0845 AM    Call this number if you have problems the morning of surgery [phone]   Remember: Do not eat food or drink liquids :After Midnight.     Take these medicines the morning of surgery with A SIP OF WATER: inhalers and bring, protonix                                You may not have any metal on your body including hair pins and              piercings  Do not wear jewelry, make-up, lotions, powders or perfumes, deodorant             Do not wear nail polish.  Do not shave  48 hours prior to surgery.     Do not bring valuables to the hospital. [facility] IS NOT             RESPONSIBLE   FOR VALUABLES.  Contacts, dentures or bridgework may not be worn into surgery.     Patients discharged the day of surgery will not be allowed to drive home.  Name and phone number of your driver:  Special Instructions: N/A              Please read over the following fact sheets you were given: _____________________________________________________________________           [facility] - Preparing for Surgery Before surgery, you can play an important role.  Because skin is not sterile, your skin needs to be as free of germs as possible.  You can reduce the number of germs on your skin by washing with CHG (chlorahexidine gluconate) soap before surgery.  CHG is an antiseptic cleaner which kills germs and bonds with the skin to continue killing germs even after washing. Please DO NOT use if you have an allergy to CHG or antibacterial soaps.  If your skin becomes reddened/irritated stop using the CHG and inform your nurse when you arrive at Short Stay. Do not shave (including legs and underarms) for at least 48 hours prior to the first CHG shower.  You may shave your face/neck. Please follow these instructions carefully:  1.   Shower with CHG Soap the night before surgery and the  morning of Surgery.  2.  If you choose to wash your hair, wash your hair first as usual with your  normal  shampoo.  3.  After you shampoo, rinse your hair and body thoroughly to remove the  shampoo.                           4.  Use CHG as you would any other liquid soap.  You can apply chg directly  to the skin and wash                       Gently with a scrungie or clean washcloth.  5.  Apply the CHG Soap to your body ONLY FROM THE NECK DOWN.   Do not use on face/  open                           Wound or open sores. Avoid contact with eyes, ears mouth and genitals (private parts).                       Wash face,  Genitals (private parts) with your normal soap.             6.  Wash thoroughly, paying special attention to the area where your surgery  will be performed.  7.  Thoroughly rinse your body with warm water from the neck down.  8.  DO NOT shower/wash with your normal soap after using and rinsing off  the CHG Soap.                9.  Pat yourself dry with a clean towel.            10.  Wear clean pajamas.            11.  Place clean sheets on your bed the night of your first shower and do not  sleep with pets. Day of Surgery : Do not apply any lotions/deodorants the morning of surgery.  Please wear clean clothes to the hospital/surgery center.  FAILURE TO FOLLOW THESE INSTRUCTIONS MAY RESULT IN THE CANCELLATION OF YOUR SURGERY PATIENT SIGNATURE_________________________________  NURSE SIGNATURE__________________________________  ________________________________________________________________________

## 2017-09-07 ENCOUNTER — Ambulatory Visit (HOSPITAL_COMMUNITY)
Admission: RE | Admit: 2017-09-07 | Discharge: 2017-09-07 | Disposition: A | Discharge status: Home / Self Care | Payer: Medicare Other | Source: Ambulatory Visit | Attending: Surgery | Admitting: Surgery

## 2017-09-07 ENCOUNTER — Other Ambulatory Visit: Payer: Self-pay

## 2017-09-07 ENCOUNTER — Encounter (HOSPITAL_COMMUNITY)
Admission: RE | Admit: 2017-09-07 | Discharge: 2017-09-07 | Disposition: A | Discharge status: Home / Self Care | Payer: Medicare Other | Source: Ambulatory Visit | Attending: Surgery | Admitting: Surgery

## 2017-09-07 ENCOUNTER — Encounter (HOSPITAL_COMMUNITY): Payer: Self-pay

## 2017-09-07 DIAGNOSIS — R001 Bradycardia, unspecified: Secondary | ICD-10-CM | POA: Insufficient documentation

## 2017-09-07 DIAGNOSIS — Z01818 Encounter for other preprocedural examination: Secondary | ICD-10-CM | POA: Insufficient documentation

## 2017-09-07 DIAGNOSIS — I700 Atherosclerosis of aorta: Secondary | ICD-10-CM | POA: Insufficient documentation

## 2017-09-07 DIAGNOSIS — Z01810 Encounter for preprocedural cardiovascular examination: Secondary | ICD-10-CM

## 2017-09-07 DIAGNOSIS — K819 Cholecystitis, unspecified: Secondary | ICD-10-CM | POA: Insufficient documentation

## 2017-09-07 DIAGNOSIS — R918 Other nonspecific abnormal finding of lung field: Secondary | ICD-10-CM | POA: Diagnosis not present

## 2017-09-07 HISTORY — DX: Headache: R51

## 2017-09-07 HISTORY — DX: Headache, unspecified: R51.9

## 2017-09-07 HISTORY — DX: Chronic obstructive pulmonary disease, unspecified: J44.9

## 2017-09-07 LAB — CBC WITH DIFFERENTIAL/PLATELET
BASOS ABS: 0 10*3/uL (ref 0.0–0.1)
BASOS PCT: 0 %
EOS ABS: 0.3 10*3/uL (ref 0.0–0.7)
EOS PCT: 4 %
HCT: 39 % (ref 36.0–46.0)
Hemoglobin: 13.1 g/dL (ref 12.0–15.0)
Lymphocytes Relative: 22 %
Lymphs Abs: 1.7 10*3/uL (ref 0.7–4.0)
MCH: 31.6 pg (ref 26.0–34.0)
MCHC: 33.6 g/dL (ref 30.0–36.0)
MCV: 94 fL (ref 78.0–100.0)
Monocytes Absolute: 0.6 10*3/uL (ref 0.1–1.0)
Monocytes Relative: 7 %
NEUTROS ABS: 5.2 10*3/uL (ref 1.7–7.7)
NEUTROS PCT: 67 %
PLATELETS: 373 10*3/uL (ref 150–400)
RBC: 4.15 MIL/uL (ref 3.87–5.11)
RDW: 14.1 % (ref 11.5–15.5)
WBC: 7.9 10*3/uL (ref 4.0–10.5)

## 2017-09-07 LAB — COMPREHENSIVE METABOLIC PANEL
ALBUMIN: 3.4 g/dL — AB (ref 3.5–5.0)
ALT: 59 U/L — AB (ref 14–54)
AST: 51 U/L — ABNORMAL HIGH (ref 15–41)
Alkaline Phosphatase: 83 U/L (ref 38–126)
Anion gap: 11 (ref 5–15)
BUN: 12 mg/dL (ref 6–20)
CHLORIDE: 101 mmol/L (ref 101–111)
CO2: 25 mmol/L (ref 22–32)
CREATININE: 0.64 mg/dL (ref 0.44–1.00)
Calcium: 9.6 mg/dL (ref 8.9–10.3)
GFR calc Af Amer: >60 mL/min (ref >60)
GFR calc non Af Amer: >60 mL/min (ref >60)
Glucose, Bld: 86 mg/dL (ref 65–99)
Potassium: 4.8 mmol/L (ref 3.5–5.1)
SODIUM: 137 mmol/L (ref 135–145)
Total Bilirubin: 0.4 mg/dL (ref 0.3–1.2)
Total Protein: 7 g/dL (ref 6.5–8.1)

## 2017-09-10 ENCOUNTER — Encounter (HOSPITAL_COMMUNITY): Payer: Self-pay | Admitting: *Deleted

## 2017-09-10 ENCOUNTER — Ambulatory Visit (HOSPITAL_COMMUNITY)
Admission: RE | Admit: 2017-09-10 | Discharge: 2017-09-10 | Disposition: A | Discharge status: Home / Self Care | Payer: Medicare Other | Source: Ambulatory Visit | Attending: Surgery | Admitting: Surgery

## 2017-09-10 ENCOUNTER — Ambulatory Visit (HOSPITAL_COMMUNITY): Payer: Medicare Other | Admitting: Anesthesiology

## 2017-09-10 ENCOUNTER — Other Ambulatory Visit: Payer: Self-pay

## 2017-09-10 ENCOUNTER — Encounter (HOSPITAL_COMMUNITY)
Admission: RE | Disposition: A | Discharge status: Home / Self Care | Payer: Self-pay | Source: Ambulatory Visit | Attending: Surgery

## 2017-09-10 DIAGNOSIS — Z79891 Long term (current) use of opiate analgesic: Secondary | ICD-10-CM | POA: Insufficient documentation

## 2017-09-10 DIAGNOSIS — J449 Chronic obstructive pulmonary disease, unspecified: Secondary | ICD-10-CM | POA: Insufficient documentation

## 2017-09-10 DIAGNOSIS — Z79899 Other long term (current) drug therapy: Secondary | ICD-10-CM | POA: Diagnosis not present

## 2017-09-10 DIAGNOSIS — F17210 Nicotine dependence, cigarettes, uncomplicated: Secondary | ICD-10-CM | POA: Insufficient documentation

## 2017-09-10 DIAGNOSIS — Z7982 Long term (current) use of aspirin: Secondary | ICD-10-CM | POA: Diagnosis not present

## 2017-09-10 DIAGNOSIS — K819 Cholecystitis, unspecified: Secondary | ICD-10-CM | POA: Diagnosis not present

## 2017-09-10 DIAGNOSIS — K8000 Calculus of gallbladder with acute cholecystitis without obstruction: Secondary | ICD-10-CM | POA: Diagnosis not present

## 2017-09-10 DIAGNOSIS — K8050 Calculus of bile duct without cholangitis or cholecystitis without obstruction: Secondary | ICD-10-CM

## 2017-09-10 HISTORY — PX: CHOLECYSTECTOMY: SHX55

## 2017-09-10 SURGERY — LAPAROSCOPIC CHOLECYSTECTOMY
Anesthesia: General

## 2017-09-10 MED ORDER — GLYCOPYRROLATE 0.2 MG/ML IV SOSY
PREFILLED_SYRINGE | INTRAVENOUS | Status: AC
  Filled 2017-09-10: qty 5

## 2017-09-10 MED ORDER — CEFAZOLIN SODIUM-DEXTROSE 2-4 GM/100ML-% IV SOLN
2.0000 g | INTRAVENOUS | Status: AC
  Administered 2017-09-10: 2 g via INTRAVENOUS
  Filled 2017-09-10: qty 100

## 2017-09-10 MED ORDER — DEXAMETHASONE SODIUM PHOSPHATE 4 MG/ML IJ SOLN
INTRAMUSCULAR | Status: DC | PRN
  Administered 2017-09-10: 10 mg via INTRAVENOUS

## 2017-09-10 MED ORDER — GLYCOPYRROLATE 0.2 MG/ML IV SOSY
PREFILLED_SYRINGE | INTRAVENOUS | Status: DC | PRN
  Administered 2017-09-10: .2 mg via INTRAVENOUS

## 2017-09-10 MED ORDER — ROCURONIUM BROMIDE 50 MG/5ML IV SOSY
PREFILLED_SYRINGE | INTRAVENOUS | Status: DC | PRN
  Administered 2017-09-10 (×2): 10 mg via INTRAVENOUS
  Administered 2017-09-10: 40 mg via INTRAVENOUS

## 2017-09-10 MED ORDER — SUGAMMADEX SODIUM 200 MG/2ML IV SOLN
INTRAVENOUS | Status: AC
  Filled 2017-09-10: qty 2

## 2017-09-10 MED ORDER — ACETAMINOPHEN 500 MG PO TABS
1000.0000 mg | ORAL_TABLET | ORAL | Status: AC
  Administered 2017-09-10: 1000 mg via ORAL
  Filled 2017-09-10: qty 2

## 2017-09-10 MED ORDER — LIDOCAINE 2% (20 MG/ML) 5 ML SYRINGE
INTRAMUSCULAR | Status: AC
  Filled 2017-09-10: qty 10

## 2017-09-10 MED ORDER — LIDOCAINE 2% (20 MG/ML) 5 ML SYRINGE
INTRAMUSCULAR | Status: DC | PRN
  Administered 2017-09-10: 50 mg via INTRAVENOUS
  Administered 2017-09-10: 80 mg via INTRAVENOUS

## 2017-09-10 MED ORDER — DEXAMETHASONE SODIUM PHOSPHATE 10 MG/ML IJ SOLN
INTRAMUSCULAR | Status: AC
  Filled 2017-09-10: qty 1

## 2017-09-10 MED ORDER — ROCURONIUM BROMIDE 10 MG/ML (PF) SYRINGE
PREFILLED_SYRINGE | INTRAVENOUS | Status: AC
  Filled 2017-09-10: qty 5

## 2017-09-10 MED ORDER — LIDOCAINE 2% (20 MG/ML) 5 ML SYRINGE
INTRAMUSCULAR | Status: DC | PRN
  Administered 2017-09-10: 1.5 mg/kg/h via INTRAVENOUS

## 2017-09-10 MED ORDER — FENTANYL CITRATE (PF) 100 MCG/2ML IJ SOLN
INTRAMUSCULAR | Status: DC | PRN
  Administered 2017-09-10 (×4): 50 ug via INTRAVENOUS

## 2017-09-10 MED ORDER — ONDANSETRON HCL 4 MG/2ML IJ SOLN
INTRAMUSCULAR | Status: AC
  Filled 2017-09-10: qty 2

## 2017-09-10 MED ORDER — DOCUSATE SODIUM 100 MG PO CAPS: 100.0000 mg | ORAL_CAPSULE | Freq: Two times a day (BID) | ORAL | 0 refills | Status: AC

## 2017-09-10 MED ORDER — ONDANSETRON HCL 4 MG/2ML IJ SOLN
INTRAMUSCULAR | Status: DC | PRN
  Administered 2017-09-10: 4 mg via INTRAVENOUS

## 2017-09-10 MED ORDER — ESMOLOL HCL 100 MG/10ML IV SOLN
INTRAVENOUS | Status: DC | PRN
  Administered 2017-09-10: 5 mg via INTRAVENOUS
  Administered 2017-09-10: 10 mg via INTRAVENOUS

## 2017-09-10 MED ORDER — FENTANYL CITRATE (PF) 100 MCG/2ML IJ SOLN: 25.0000 ug | INTRAMUSCULAR | Status: DC | PRN

## 2017-09-10 MED ORDER — CHLORHEXIDINE GLUCONATE 4 % EX LIQD: 60.0000 mL | Freq: Once | CUTANEOUS | Status: DC

## 2017-09-10 MED ORDER — ESMOLOL HCL 100 MG/10ML IV SOLN
INTRAVENOUS | Status: AC
  Filled 2017-09-10: qty 10

## 2017-09-10 MED ORDER — PROPOFOL 10 MG/ML IV BOLUS
INTRAVENOUS | Status: AC
  Filled 2017-09-10: qty 20

## 2017-09-10 MED ORDER — PROPOFOL 10 MG/ML IV BOLUS
INTRAVENOUS | Status: DC | PRN
  Administered 2017-09-10: 100 mg via INTRAVENOUS

## 2017-09-10 MED ORDER — GABAPENTIN 300 MG PO CAPS
300.0000 mg | ORAL_CAPSULE | ORAL | Status: AC
  Administered 2017-09-10: 300 mg via ORAL
  Filled 2017-09-10: qty 1

## 2017-09-10 MED ORDER — OXYCODONE-ACETAMINOPHEN 5-325 MG PO TABS: 1.0000 | ORAL_TABLET | Freq: Four times a day (QID) | ORAL | 0 refills | Status: DC | PRN

## 2017-09-10 MED ORDER — PHENYLEPHRINE 40 MCG/ML (10ML) SYRINGE FOR IV PUSH (FOR BLOOD PRESSURE SUPPORT)
PREFILLED_SYRINGE | INTRAVENOUS | Status: AC
  Filled 2017-09-10: qty 10

## 2017-09-10 MED ORDER — EPHEDRINE 5 MG/ML INJ
INTRAVENOUS | Status: AC
  Filled 2017-09-10: qty 10

## 2017-09-10 MED ORDER — LACTATED RINGERS IV SOLN
INTRAVENOUS | Status: AC | PRN
  Administered 2017-09-10: 1000 mL via INTRAVENOUS

## 2017-09-10 MED ORDER — FENTANYL CITRATE (PF) 250 MCG/5ML IJ SOLN
INTRAMUSCULAR | Status: AC
  Filled 2017-09-10: qty 5

## 2017-09-10 MED ORDER — BUPIVACAINE HCL (PF) 0.25 % IJ SOLN
INTRAMUSCULAR | Status: AC
  Filled 2017-09-10: qty 30

## 2017-09-10 MED ORDER — MIDAZOLAM HCL 5 MG/5ML IJ SOLN
INTRAMUSCULAR | Status: DC | PRN
  Administered 2017-09-10: 1 mg via INTRAVENOUS

## 2017-09-10 MED ORDER — EPHEDRINE SULFATE-NACL 50-0.9 MG/10ML-% IV SOSY
PREFILLED_SYRINGE | INTRAVENOUS | Status: DC | PRN
  Administered 2017-09-10: 5 mg via INTRAVENOUS
  Administered 2017-09-10: 10 mg via INTRAVENOUS

## 2017-09-10 MED ORDER — CELECOXIB 200 MG PO CAPS
200.0000 mg | ORAL_CAPSULE | ORAL | Status: AC
  Administered 2017-09-10: 200 mg via ORAL
  Filled 2017-09-10: qty 1

## 2017-09-10 MED ORDER — OXYCODONE HCL 5 MG PO TABS: 5.0000 mg | ORAL_TABLET | Freq: Once | ORAL | Status: DC | PRN

## 2017-09-10 MED ORDER — PHENYLEPHRINE 40 MCG/ML (10ML) SYRINGE FOR IV PUSH (FOR BLOOD PRESSURE SUPPORT)
PREFILLED_SYRINGE | INTRAVENOUS | Status: DC | PRN
  Administered 2017-09-10: 80 ug via INTRAVENOUS

## 2017-09-10 MED ORDER — OXYCODONE HCL 5 MG/5ML PO SOLN: 5.0000 mg | Freq: Once | ORAL | Status: DC | PRN

## 2017-09-10 MED ORDER — SUGAMMADEX SODIUM 200 MG/2ML IV SOLN
INTRAVENOUS | Status: DC | PRN
  Administered 2017-09-10: 125 mg via INTRAVENOUS

## 2017-09-10 MED ORDER — BUPIVACAINE-EPINEPHRINE 0.25% -1:200000 IJ SOLN
INTRAMUSCULAR | Status: DC | PRN
  Administered 2017-09-10: 30 mL

## 2017-09-10 MED ORDER — MIDAZOLAM HCL 2 MG/2ML IJ SOLN
INTRAMUSCULAR | Status: AC
  Filled 2017-09-10: qty 2

## 2017-09-10 MED ORDER — LACTATED RINGERS IV SOLN
INTRAVENOUS | Status: DC
  Administered 2017-09-10 (×2): via INTRAVENOUS

## 2017-09-10 SURGICAL SUPPLY — 35 items
ADH SKN CLS APL DERMABOND .7 (GAUZE/BANDAGES/DRESSINGS) ×1
APPLIER CLIP ROT 10 11.4 M/L (STAPLE) ×2
APR CLP MED LRG 11.4X10 (STAPLE) ×1
BAG SPEC RTRVL LRG 6X4 10 (ENDOMECHANICALS) ×1
CABLE HIGH FREQUENCY MONO STRZ (ELECTRODE) ×2 IMPLANT
CHLORAPREP W/TINT 26ML (MISCELLANEOUS) ×2 IMPLANT
CLIP APPLIE ROT 10 11.4 M/L (STAPLE) ×1 IMPLANT
COVER MAYO STAND STRL (DRAPES) IMPLANT
COVER SURGICAL LIGHT HANDLE (MISCELLANEOUS) ×2 IMPLANT
DECANTER SPIKE VIAL GLASS SM (MISCELLANEOUS) ×1 IMPLANT
DERMABOND ADVANCED (GAUZE/BANDAGES/DRESSINGS) ×1
DERMABOND ADVANCED .7 DNX12 (GAUZE/BANDAGES/DRESSINGS) ×1 IMPLANT
DRAPE C-ARM 42X120 X-RAY (DRAPES) IMPLANT
ELECT REM PT RETURN 15FT ADLT (MISCELLANEOUS) ×2 IMPLANT
GLOVE BIO SURGEON STRL SZ 6 (GLOVE) ×2 IMPLANT
GLOVE INDICATOR 6.5 STRL GRN (GLOVE) ×2 IMPLANT
GOWN STRL REUS W/TWL LRG LVL3 (GOWN DISPOSABLE) ×2 IMPLANT
GOWN STRL REUS W/TWL XL LVL3 (GOWN DISPOSABLE) ×3 IMPLANT
GRASPER SUT TROCAR 14GX15 (MISCELLANEOUS) ×2 IMPLANT
HEMOSTAT SNOW SURGICEL 2X4 (HEMOSTASIS) IMPLANT
KIT BASIN OR (CUSTOM PROCEDURE TRAY) ×2 IMPLANT
NDL INSUFFLATION 14GA 120MM (NEEDLE) ×1 IMPLANT
NEEDLE INSUFFLATION 14GA 120MM (NEEDLE) ×2 IMPLANT
POUCH SPECIMEN RETRIEVAL 10MM (ENDOMECHANICALS) ×2 IMPLANT
SCISSORS LAP 5X35 DISP (ENDOMECHANICALS) ×2 IMPLANT
SET CHOLANGIOGRAPH MIX (MISCELLANEOUS) IMPLANT
SET IRRIG TUBING LAPAROSCOPIC (IRRIGATION / IRRIGATOR) ×2 IMPLANT
SLEEVE XCEL OPT CAN 5 100 (ENDOMECHANICALS) ×4 IMPLANT
SUT MNCRL AB 4-0 PS2 18 (SUTURE) ×2 IMPLANT
TOWEL OR 17X26 10 PK STRL BLUE (TOWEL DISPOSABLE) ×2 IMPLANT
TOWEL OR NON WOVEN STRL DISP B (DISPOSABLE) ×1 IMPLANT
TRAY LAPAROSCOPIC (CUSTOM PROCEDURE TRAY) ×2 IMPLANT
TROCAR BLADELESS OPT 5 100 (ENDOMECHANICALS) ×2 IMPLANT
TROCAR XCEL 12X100 BLDLESS (ENDOMECHANICALS) ×2 IMPLANT
TUBING INSUF HEATED (TUBING) ×2 IMPLANT

## 2017-09-10 NOTE — Transfer of Care (Signed)
Immediate Anesthesia Transfer of Care Note  Patient: [name]  Procedure(s) Performed: Procedure(s): LAPAROSCOPIC CHOLECYSTECTOMY (N/A)  Patient Location: PACU  Anesthesia Type:General  Level of Consciousness: Patient easily awoken, sedated, comfortable, cooperative, following commands, responds to stimulation.   Airway & Oxygen Therapy: Patient spontaneously breathing, ventilating well, oxygen via simple oxygen mask.  Post-op Assessment: Report given to PACU RN, vital signs reviewed and stable, moving all extremities.   Post vital signs: Reviewed and stable.  Complications: No apparent anesthesia complications Last Vitals:  Vitals Value Taken Time  BP 129/58 09/10/2017 11:56 AM  Temp 36.7 C 09/10/2017 11:56 AM  Pulse 73 09/10/2017 12:00 PM  Resp 16 09/10/2017 12:00 PM  SpO2 100 % 09/10/2017 12:00 PM  Vitals shown include unvalidated device data.  Last Pain:  Vitals:   09/10/17 0846  TempSrc: Oral      Patients Stated Pain Goal: 4 (09/10/17 0851)  Complications: No apparent anesthesia complications

## 2017-09-10 NOTE — Anesthesia Procedure Notes (Signed)
Procedure Name: Intubation Date/Time: 09/10/2017 10:37 AM Performed by: Deliah Boston, CRNA Pre-anesthesia Checklist: Patient identified, Emergency Drugs available, Suction available and Patient being monitored Patient Re-evaluated:Patient Re-evaluated prior to induction Oxygen Delivery Method: Circle system utilized Preoxygenation: Pre-oxygenation with 100% oxygen Induction Type: IV induction Ventilation: Mask ventilation without difficulty Laryngoscope Size: Mac and 3 Grade View: Grade I Tube type: Oral Tube size: 7.5 mm Number of attempts: 1 Airway Equipment and Method: Stylet and Oral airway Placement Confirmation: ETT inserted through vocal cords under direct vision,  positive ETCO2 and breath sounds checked- equal and bilateral Secured at: 21 cm Tube secured with: Tape Dental Injury: Teeth and Oropharynx as per pre-operative assessment

## 2017-09-10 NOTE — Anesthesia Preprocedure Evaluation (Addendum)
Anesthesia Evaluation  Patient identified by MRN, date of birth, ID band Patient awake    Reviewed: Allergy & Precautions, NPO status , Patient's Chart, lab work & pertinent test results  Airway Mallampati: I  TM Distance: >3 FB Neck ROM: Full    Dental no notable dental hx. (+) Edentulous Upper, Edentulous Lower   Pulmonary COPD,  COPD inhaler, Current Smoker,    breath sounds clear to auscultation       Cardiovascular negative cardio ROS   Rhythm:Regular Rate:Normal     Neuro/Psych  Headaches, negative psych ROS   GI/Hepatic negative GI ROS, Neg liver ROS,   Endo/Other  negative endocrine ROS  Renal/GU negative Renal ROS     Musculoskeletal negative musculoskeletal ROS (+)   Abdominal   Peds  Hematology negative hematology ROS (+)   Anesthesia Other Findings   Reproductive/Obstetrics                            Anesthesia Physical Anesthesia Plan  ASA: II  Anesthesia Plan: General   Post-op Pain Management:    Induction: Intravenous  PONV Risk Score and Plan: 4 or greater and Ondansetron and Dexamethasone  Airway Management Planned: Oral ETT  Additional Equipment: None  Intra-op Plan:   Post-operative Plan: Extubation in OR  Informed Consent: I have reviewed the patients History and Physical, chart, labs and discussed the procedure including the risks, benefits and alternatives for the proposed anesthesia with the patient or authorized representative who has indicated his/her understanding and acceptance.   Dental advisory given  Plan Discussed with: CRNA and Surgeon  Anesthesia Plan Comments:       Anesthesia Quick Evaluation

## 2017-09-10 NOTE — Op Note (Signed)
Operative Note  [name] 67 y.o. female [id number]  09/10/2017  Surgeon: Berna Bue MD  Assistant: Jaclynn Guarneri, MD  Procedure performed: Laparoscopic Cholecystectomy  Preop diagnosis: cholecystitis Post-op diagnosis/intraop findings: same  Specimens: gallbladder  EBL: minimal  Complications: none  Description of procedure: After obtaining informed consent the patient was brought to the operating room. Prophylactic antibiotics and subcutaneous heparin were administered. SCD's were applied. General endotracheal anesthesia was initiated and a formal time-out was performed. The abdomen was prepped and draped in the usual sterile fashion and the abdomen was entered using an infraumbilical veress needle after instilling the site with local. Insufflation to was obtained, 5mm trocar and camera placed and gross inspection revealed no evidence of injury from our entry or other intraabdominal abnormalities. Two 5mm trocars were introduced in the right midclavicular and right anterior axillary lines under direct visualization and following infiltration with local. An 11mm trocar was placed in the epigastrium. The gallbladder was retracted cephalad and the infundibulum was retracted laterally. It was encased in omental adhesions which were taken down bluntly and with cautery. The gallbladder was inflamed with an extremely thickened wall. A combination of hook electrocautery and blunt dissection was utilized to clear the peritoneum from the neck and cystic duct, circumferentially isolating the cystic artery and cystic duct and lifting the gallbladder from the cystic plate. The critical view of safety was achieved with the cystic artery, cystic duct, and liver bed visualized between them with no other structures. The artery was clipped with a two clips proximally and one distally and divided as was the cystic duct with three clips on the proximal end. The gallbladder was dissected from the  liver plate using electrocautery. Once freed the gallbladder was placed in an endocatch bag and removed intact through the epigastric trocar site. A small amount of bleeding on the liver bed was controlled with cautery. The right upper quadrant was irrigated and the effluent was clear. Hemostasis was once again confirmed, and reinspection of the abdomen revealed no injuries. The clips were well opposed without any bile leak from the duct or the liver bed. The 11mm trocar site in the epigastrium was closed with two 0 vicryls in the fascia under direct visualization using a PMI device. The abdomen was desufflated and all trocars removed. The skin incisions were closed with running subcuticular monocryl and Dermabond. The patient was awakened, extubated and transported to the recovery room in stable condition.   All counts were correct at the completion of the case.

## 2017-09-10 NOTE — Interval H&P Note (Signed)
History and Physical Interval Note:  09/10/2017 9:27 AM  [name]  has presented today for surgery, with the diagnosis of CHOLECYSTITIS  The various methods of treatment have been discussed with the patient and family. After consideration of risks, benefits and other options for treatment, the patient has consented to  Procedure(s): LAPAROSCOPIC CHOLECYSTECTOMY (N/A) as a surgical intervention .  The patient's history has been reviewed, patient examined, no change in status, stable for surgery.  I have reviewed the patient's chart and labs.  Questions were answered to the patient's satisfaction.     Rickiya Picariello Lollie Sails

## 2017-09-10 NOTE — Anesthesia Postprocedure Evaluation (Signed)
Anesthesia Post Note  Patient: [name]  Procedure(s) Performed: LAPAROSCOPIC CHOLECYSTECTOMY (N/A )     Patient location during evaluation: PACU Anesthesia Type: General Level of consciousness: sedated and patient cooperative Pain management: pain level controlled Vital Signs Assessment: post-procedure vital signs reviewed and stable Respiratory status: spontaneous breathing Cardiovascular status: stable Anesthetic complications: no    Last Vitals:  Vitals:   09/10/17 1200 09/10/17 1215  BP: 126/67 120/65  Pulse: 68 79  Resp: 20 19  Temp:    SpO2: 100% 100%    Last Pain:  Vitals:   09/10/17 1215  TempSrc:   PainSc: 0-No pain                 Lewie Loron

## 2017-09-10 NOTE — Discharge Instructions (Signed)
LAPAROSCOPIC SURGERY: POST OP INSTRUCTIONS ° °###################################################################### ° °EAT °Gradually transition to a high fiber diet with a fiber supplement over the next few weeks after discharge.  Start with a pureed / full liquid diet (see below) ° °WALK °Walk an hour a day.  Control your pain to do that.   ° °CONTROL PAIN °Control pain so that you can walk, sleep, tolerate sneezing/coughing, go up/down stairs. ° °HAVE A BOWEL MOVEMENT DAILY °Keep your bowels regular to avoid problems.  OK to try a laxative to override constipation.  OK to use an antidairrheal to slow down diarrhea.  Call if not better after 2 tries ° °CALL IF YOU HAVE PROBLEMS/CONCERNS °Call if you are still struggling despite following these instructions. °Call if you have concerns not answered by these instructions ° °###################################################################### ° ° ° °1. DIET: Follow a light bland diet the first 24 hours after arrival home, such as soup, liquids, crackers, etc.  Be sure to include lots of fluids daily.  Avoid fast food or heavy meals as your are more likely to get nauseated.  Eat a low fat the next few days after surgery.   °2. Take your usually prescribed home medications unless otherwise directed. °3. PAIN CONTROL: °a. Pain is best controlled by a usual combination of three different methods TOGETHER: °i. Ice/Heat °ii. Over the counter pain medication °iii. Prescription pain medication °b. Most patients will experience some swelling and bruising around the incisions.  Ice packs or heating pads (30-60 minutes up to 6 times a day) will help. Use ice for the first few days to help decrease swelling and bruising, then switch to heat to help relax tight/sore spots and speed recovery.  Some people prefer to use ice alone, heat alone, alternating between ice & heat.  Experiment to what works for you.  Swelling and bruising can take several weeks to resolve.   °c. It is  helpful to take an over-the-counter pain medication regularly for the first few weeks.  Choose one of the following that works best for you: °i. Naproxen (Aleve, etc)  Two 220mg tabs twice a day °ii. Ibuprofen (Advil, etc) Three 200mg tabs four times a day (every meal & bedtime) °iii. Acetaminophen (Tylenol, etc) 500-650mg four times a day (every meal & bedtime) °d. A  prescription for pain medication (such as oxycodone, hydrocodone, etc) should be given to you upon discharge.  Take your pain medication as prescribed.  °i. If you are having problems/concerns with the prescription medicine (does not control pain, nausea, vomiting, rash, itching, etc), please call us [phone] to see if we need to switch you to a different pain medicine that will work better for you and/or control your side effect better. °ii. If you need a refill on your pain medication, please contact your pharmacy.  They will contact our office to request authorization. Prescriptions will not be filled after 5 pm or on week-ends. °4. Avoid getting constipated.  Between the surgery and the pain medications, it is common to experience some constipation.  Increasing fluid intake and taking a fiber supplement (such as Metamucil, Citrucel, FiberCon, MiraLax, etc) 1-2 times a day regularly will usually help prevent this problem from occurring.  A mild laxative (prune juice, Milk of Magnesia, MiraLax, etc) should be taken according to package directions if there are no bowel movements after 48 hours.   °5. Watch out for diarrhea.  If you have many loose bowel movements, simplify your diet to bland foods & liquids for   a few days.  Stop any stool softeners and decrease your fiber supplement.  Switching to mild anti-diarrheal medications (Kayopectate, Pepto Bismol) can help.  If this worsens or does not improve, please call us. °6. Wash / shower every day.  You may shower over the skin glue which is waterproof.  No rubbing, scrubbing, lotions or  ointments to incisions. °7. Skin glue will flake off after about 2 weeks.  You may leave the incision open to air.  You may replace a dressing/Band-Aid to cover the incision for comfort if you wish.  °8. ACTIVITIES as tolerated:   °a. You may resume regular (light) daily activities beginning the next day--such as daily self-care, walking, climbing stairs--gradually increasing activities as tolerated.  If you can walk 30 minutes without difficulty, it is safe to try more intense activity such as jogging, treadmill, bicycling, low-impact aerobics, swimming, etc. °b. Save the most intensive and strenuous activity for last such as sit-ups, heavy lifting, contact sports, etc  Refrain from any heavy lifting or straining until you are off narcotics for pain control.   °c. DO NOT PUSH THROUGH PAIN.  Let pain be your guide: If it hurts to do something, don't do it.  Pain is your body warning you to avoid that activity for another week until the pain goes down. °d. You may drive when you are no longer taking prescription pain medication, you can comfortably wear a seatbelt, and you can safely maneuver your car and apply brakes. °e. You may have sexual intercourse when it is comfortable.  °9. FOLLOW UP in our office °a. Please call [facility] at [phone] to set up an appointment to see your surgeon in the office for a follow-up appointment approximately 2-3 weeks after your surgery. °b. Make sure that you call for this appointment the day you arrive home to insure a convenient appointment time. °10. IF YOU HAVE DISABILITY OR FAMILY LEAVE FORMS, BRING THEM TO THE OFFICE FOR PROCESSING.  DO NOT GIVE THEM TO YOUR DOCTOR. ° ° °WHEN TO CALL US [phone]: °1. Poor pain control °2. Reactions / problems with new medications (rash/itching, nausea, etc)  °3. Fever over 101.5 F (38.5 C) °4. Inability to urinate °5. Nausea and/or vomiting °6. Worsening swelling or bruising °7. Continued bleeding from incision. °8. Increased pain,  redness, or drainage from the incision ° ° The clinic staff is available to answer your questions during regular business hours (8:30am-5pm).  Please don’t hesitate to call and ask to speak to one of our nurses for clinical concerns.  ° If you have a medical emergency, go to the nearest emergency room or call 911. ° A surgeon from [facility] is always on call at the hospitals ° ° °[facility] °[address], [address], [place],   [address] ? °MAIN: [phone] ? TOLL FREE: [phone] ?  °FAX [phone] °[email or web address] ° ° °

## 2017-09-17 NOTE — Addendum Note (Signed)
Addendum  created 09/17/17 0735 by Elyn Peers, CRNA   Intraprocedure Event edited, Intraprocedure Meds edited

## 2017-11-23 DIAGNOSIS — Z23 Encounter for immunization: Secondary | ICD-10-CM | POA: Diagnosis not present

## 2017-11-23 DIAGNOSIS — E7800 Pure hypercholesterolemia, unspecified: Secondary | ICD-10-CM | POA: Diagnosis not present

## 2017-11-23 DIAGNOSIS — Z0001 Encounter for general adult medical examination with abnormal findings: Secondary | ICD-10-CM | POA: Diagnosis not present

## 2017-11-23 DIAGNOSIS — J449 Chronic obstructive pulmonary disease, unspecified: Secondary | ICD-10-CM | POA: Diagnosis not present

## 2017-12-14 DIAGNOSIS — B070 Plantar wart: Secondary | ICD-10-CM | POA: Diagnosis not present

## 2018-01-08 DIAGNOSIS — B070 Plantar wart: Secondary | ICD-10-CM | POA: Diagnosis not present

## 2018-01-08 DIAGNOSIS — Z23 Encounter for immunization: Secondary | ICD-10-CM | POA: Diagnosis not present

## 2018-05-31 ENCOUNTER — Ambulatory Visit (HOSPITAL_COMMUNITY)
Admission: EM | Admit: 2018-05-31 | Discharge: 2018-05-31 | Discharge status: Against Medical Advice | Payer: Medicare Other

## 2018-05-31 NOTE — ED Notes (Signed)
Per pt access, pt lwbs 

## 2018-11-11 IMAGING — CR DG CHEST 2V
2 series · 2 of 2 positions shown · non-contrast
Comparison: 08/21/2014

CLINICAL DATA: Preoperative study for cholecystectomy. No current
chest symptoms.

EXAM:
CHEST - 2 VIEW

[w chest pa]
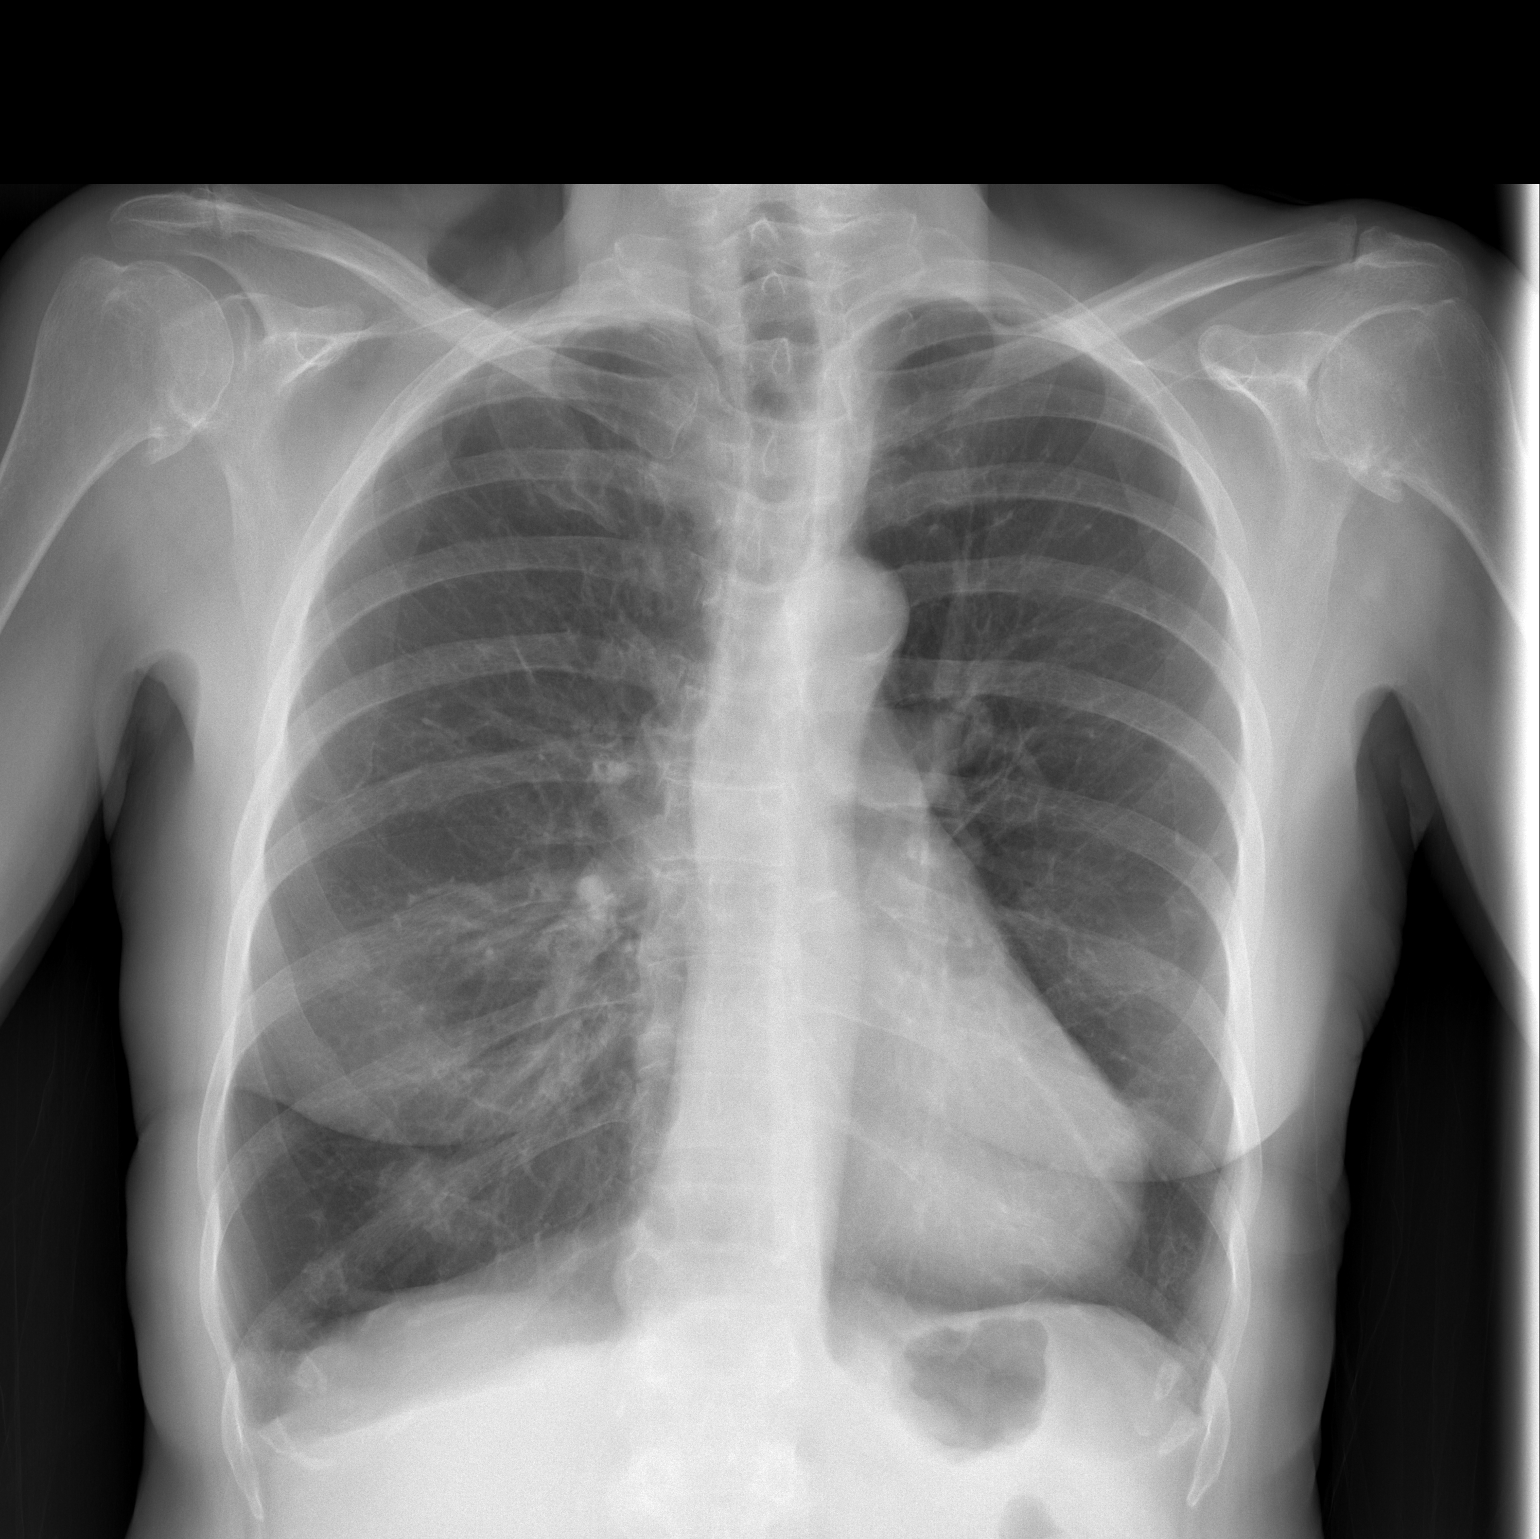

[w chest lat]
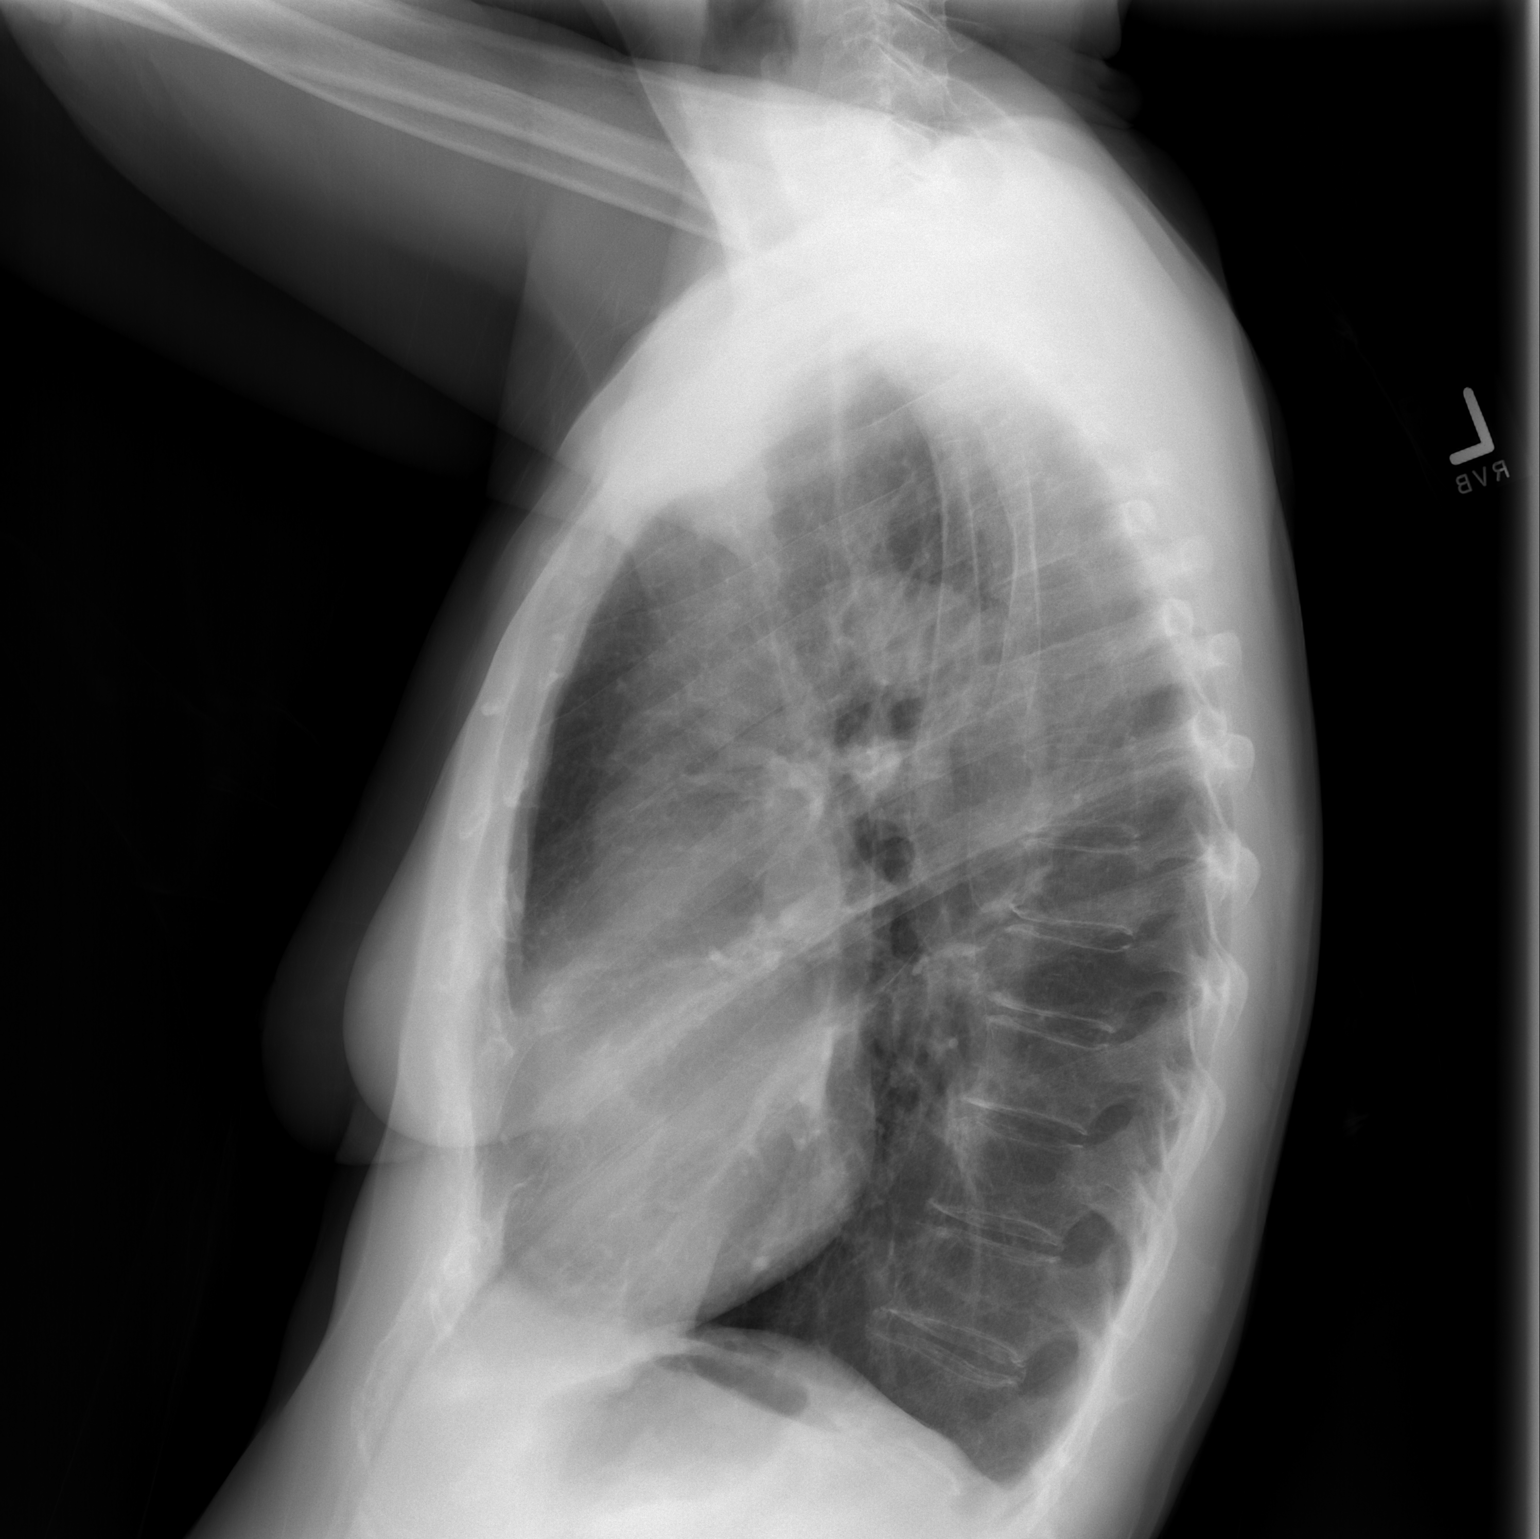

[2 of 2 positions shown; findings below may reference images not displayed]

FINDINGS: Heart size is normal. There is aortic atherosclerosis. There is
probably a degree of emphysema in the upper lobes without evidence
of advanced finding. No infiltrate, mass, effusion or collapse. No
significant bone finding. Ordinary shoulder degenerative changes.
IMPRESSION: Active cardiopulmonary disease. Suspicion of a degree of emphysema.
Aortic atherosclerosis.

## 2019-01-15 DIAGNOSIS — J449 Chronic obstructive pulmonary disease, unspecified: Secondary | ICD-10-CM | POA: Diagnosis not present

## 2019-01-15 DIAGNOSIS — M8588 Other specified disorders of bone density and structure, other site: Secondary | ICD-10-CM | POA: Diagnosis not present

## 2019-01-15 DIAGNOSIS — Z1211 Encounter for screening for malignant neoplasm of colon: Secondary | ICD-10-CM | POA: Diagnosis not present

## 2019-01-15 DIAGNOSIS — E7800 Pure hypercholesterolemia, unspecified: Secondary | ICD-10-CM | POA: Diagnosis not present

## 2019-01-15 DIAGNOSIS — Z23 Encounter for immunization: Secondary | ICD-10-CM | POA: Diagnosis not present

## 2019-01-15 DIAGNOSIS — Z0000 Encounter for general adult medical examination without abnormal findings: Secondary | ICD-10-CM | POA: Diagnosis not present

## 2019-05-17 ENCOUNTER — Other Ambulatory Visit: Payer: Self-pay

## 2019-05-17 ENCOUNTER — Ambulatory Visit (HOSPITAL_COMMUNITY)
Admission: EM | Admit: 2019-05-17 | Discharge: 2019-05-17 | Disposition: A | Discharge status: Home / Self Care | Payer: Medicare Other | Attending: Physician Assistant | Admitting: Physician Assistant

## 2019-05-17 ENCOUNTER — Encounter (HOSPITAL_COMMUNITY): Payer: Self-pay

## 2019-05-17 DIAGNOSIS — J449 Chronic obstructive pulmonary disease, unspecified: Secondary | ICD-10-CM | POA: Diagnosis not present

## 2019-05-17 DIAGNOSIS — Z20822 Contact with and (suspected) exposure to covid-19: Secondary | ICD-10-CM | POA: Diagnosis not present

## 2019-05-17 MED ORDER — ALBUTEROL SULFATE HFA 108 (90 BASE) MCG/ACT IN AERS: 1.0000 | INHALATION_SPRAY | Freq: Four times a day (QID) | RESPIRATORY_TRACT | 1 refills | Status: AC | PRN

## 2019-05-17 NOTE — ED Triage Notes (Signed)
Patient presents to Urgent Care with complaints of needing a covid test since being exposed to her mother that tested positive for covid. Patient reports she is around her pretty much every day, has no sx at this time.

## 2019-05-17 NOTE — ED Provider Notes (Addendum)
[place]    CSN: [id number] Arrival date & time: 05/17/19  1132      History   Chief Complaint Chief Complaint  Patient presents with  . COVID Exposure    HPI [name] is a 69 y.o. female.   Patient reports to urgent care today for Covid testing due to recent exposure to her mother who tested positive. Patient has a history of COPD and does not some recent increased shortness of breath with exertion but has been out of her albuterol inhaler. She denies increase in cough or sputum production. Denies fever, chills, nausea, vomiting, headache, sore throat. She believes the shortness of breath to be only a slight increase from her normal. She continues to use her controlled medications.      Past Medical History:  Diagnosis Date  . COPD (chronic obstructive pulmonary disease) ([place])   . Headache    hx of     There are no problems to display for this patient.   Past Surgical History:  Procedure Laterality Date  . CHOLECYSTECTOMY     09-10-17 Dr. Kae Heller  . CHOLECYSTECTOMY N/A 09/10/2017   Procedure: LAPAROSCOPIC CHOLECYSTECTOMY;  Surgeon: Clovis Riley, MD;  Location: [facility];  Service: General;  Laterality: N/A;  . TUBAL LIGATION      OB History   No obstetric history on file.      Home Medications    Prior to Admission medications   Medication Sig Start Date End Date Taking? Authorizing Provider  albuterol (VENTOLIN HFA) 108 (90 Base) MCG/ACT inhaler Inhale 1-2 puffs into the lungs every 6 (six) hours as needed for wheezing or shortness of breath. 05/17/19   Darald Uzzle, Marguerita Beards, PA-C  aspirin EC 81 MG tablet Take 81 mg by mouth daily.    [provider]  Bioflavonoid Products (VITAMIN C) CHEW Chew 1 tablet by mouth daily.    [provider]  budesonide-formoterol (SYMBICORT) 160-4.5 MCG/ACT inhaler Inhale 2 puffs into the lungs 2 (two) times daily.    [provider]  Calcium Carbonate-Vitamin D (CALTRATE 600+D PO) Take 1  tablet by mouth daily.    [provider]  Misc Natural Products (OSTEO BI-FLEX ADV TRIPLE ST PO) Take 1 tablet by mouth daily.    [provider]  Multiple Vitamin (MULTIVITAMIN WITH MINERALS) TABS tablet Take 1 tablet by mouth daily. CENTRUM SILVER FOR WOMEN 50+    [provider]  oxyCODONE-acetaminophen (PERCOCET/ROXICET) 5-325 MG tablet Take 1 tablet by mouth every 6 (six) hours as needed for severe pain. 09/10/17   Clovis Riley, MD  pantoprazole (PROTONIX) 40 MG tablet Take 40 mg by mouth daily before breakfast. 08/30/17   [provider]  TURMERIC PO Take 1 capsule by mouth daily with breakfast.    [provider]    Family History Family History  Problem Relation Age of Onset  . Healthy Mother     Social History Social History   Tobacco Use  . Smoking status: Current Every Day Smoker    Packs/day: 0.50    Years: 30.00    Pack years: 15.00    Types: Cigarettes  . Smokeless tobacco: Never Used  Substance Use Topics  . Alcohol use: Yes    Alcohol/week: 1.0 standard drinks    Types: 1 Shots of liquor per week    Comment: 1-2 shots a week  . Drug use: Never     Allergies   Patient has no known allergies.  Review of Systems Review of Systems  Constitutional: Negative for chills and fever.  HENT: Negative for congestion, ear pain, sinus pressure, sinus pain and sore throat.   Eyes: Negative for pain and visual disturbance.  Respiratory: Positive for cough (chronic) and shortness of breath.   Cardiovascular: Negative for chest pain and palpitations.  Gastrointestinal: Negative for abdominal pain, diarrhea, nausea and vomiting.  Genitourinary: Negative for dysuria.  Musculoskeletal: Negative for arthralgias, back pain and myalgias.  Skin: Negative for color change and rash.  Neurological: Negative for seizures, syncope and headaches.  All other systems reviewed and are negative.    Physical Exam Triage Vital  Signs ED Triage Vitals  Enc Vitals Group     BP 05/17/19 1208 (!) 148/71     Pulse Rate 05/17/19 1208 84     Resp 05/17/19 1208 19     Temp 05/17/19 1208 97.7 F (36.5 C)     Temp Source 05/17/19 1208 Oral     SpO2 05/17/19 1208 96 %     Weight --      Height --      Head Circumference --      Peak Flow --      Pain Score 05/17/19 1206 0     Pain Loc --      Pain Edu? --      Excl. in GC? --    No data found.  Updated Vital Signs BP (!) 148/71 (BP Location: Right Arm)   Pulse 84   Temp 97.7 F (36.5 C) (Oral)   Resp 19   SpO2 96%   Visual Acuity Right Eye Distance:   Left Eye Distance:   Bilateral Distance:    Right Eye Near:   Left Eye Near:    Bilateral Near:     Physical Exam Vitals and nursing note reviewed.  Constitutional:      General: She is not in acute distress.    Appearance: Normal appearance. She is well-developed. She is not ill-appearing.  HENT:     Head: Normocephalic and atraumatic.     Mouth/Throat:     Mouth: Mucous membranes are moist.     Pharynx: Oropharynx is clear.  Eyes:     General: No scleral icterus.    Conjunctiva/sclera: Conjunctivae normal.     Pupils: Pupils are equal, round, and reactive to light.  Cardiovascular:     Rate and Rhythm: Normal rate and regular rhythm.     Heart sounds: No murmur.  Pulmonary:     Effort: Pulmonary effort is normal. No respiratory distress.     Breath sounds: Normal breath sounds. No wheezing, rhonchi or rales.  Musculoskeletal:     Cervical back: Neck supple.     Right lower leg: No edema.     Left lower leg: No edema.  Skin:    General: Skin is warm and dry.  Neurological:     General: No focal deficit present.     Mental Status: She is alert and oriented to person, place, and time.  Psychiatric:        Mood and Affect: Mood normal.        Behavior: Behavior normal.        Thought Content: Thought content normal.        Judgment: Judgment normal.      UC Treatments / Results   Labs (all labs ordered are listed, but only abnormal results are displayed) Labs Reviewed  NOVEL CORONAVIRUS, NAA (HOSP ORDER, SEND-OUT TO  REF LAB; TAT 18-24 HRS)    EKG   Radiology No results found.  Procedures Procedures (including critical care time)  Medications Ordered in UC Medications - No data to display  Initial Impression / Assessment and Plan / UC Course  I have reviewed the triage vital signs and the nursing notes.  Pertinent labs & imaging results that were available during my care of the patient were reviewed by me and considered in my medical decision making (see chart for details).    #Exposure to Covid - Believe her shortness of breath to be more related to COPD as opposed to exacerbation vs illness at this time. She does not meet requirements for steroids or antibiotics at this time, saturating and moving air well. Refilled albuterol. Discussed precautions. Recommended follow up with PCP for addressing COPD. Covid PCR sent   Final Clinical Impressions(s) / UC Diagnoses   Final diagnoses:  Close exposure to COVID-19 virus  Encounter for laboratory testing for COVID-19 virus     Discharge Instructions     Follow up with your primary care provider to discuss your COPD management  Go directly to the Emergency Department or call 911 if you have severe chest pain, shortness of breath, severe diarrhea or feel as though you might pass out.    If your Covid-19 test is positive, you will receive a phone call from [facility] regarding your results. Negative test results are not called. Both positive and negative results area always visible on MyChart. If you do not have a MyChart account, sign up instructions are in your discharge papers.   Persons who are directed to care for themselves at home may discontinue isolation under the following conditions:  . At least 10 days have passed since symptom onset and . At least 24 hours have passed without running a  fever (this means without the use of fever-reducing medications) and . Other symptoms have improved.  Persons infected with COVID-19 who never develop symptoms may discontinue isolation and other precautions 10 days after the date of their first positive COVID-19 test.     ED Prescriptions    Medication Sig Dispense Auth. Provider   albuterol (VENTOLIN HFA) 108 (90 Base) MCG/ACT inhaler Inhale 1-2 puffs into the lungs every 6 (six) hours as needed for wheezing or shortness of breath. 8 g Rosie Torrez, Veryl Speak, PA-C     PDMP not reviewed this encounter.   Hermelinda Medicus, PA-C 05/17/19 1737    Aurilla Coulibaly, Veryl Speak, PA-C 05/17/19 1741

## 2019-05-17 NOTE — Discharge Instructions (Addendum)
Follow up with your primary care provider to discuss your COPD management  Go directly to the Emergency Department or call 911 if you have severe chest pain, shortness of breath, severe diarrhea or feel as though you might pass out.    If your Covid-19 test is positive, you will receive a phone call from [facility] regarding your results. Negative test results are not called. Both positive and negative results area always visible on MyChart. If you do not have a MyChart account, sign up instructions are in your discharge papers.   Persons who are directed to care for themselves at home may discontinue isolation under the following conditions:   At least 10 days have passed since symptom onset and  At least 24 hours have passed without running a fever (this means without the use of fever-reducing medications) and  Other symptoms have improved.  Persons infected with COVID-19 who never develop symptoms may discontinue isolation and other precautions 10 days after the date of their first positive COVID-19 test.

## 2019-05-18 LAB — NOVEL CORONAVIRUS, NAA (HOSP ORDER, SEND-OUT TO REF LAB; TAT 18-24 HRS): SARS-CoV-2, NAA: NOT DETECTED

## 2019-07-06 IMAGING — US US ABDOMEN COMPLETE
1 series · 13 of 25 positions shown · non-contrast
Comparison: None.

CLINICAL DATA: Severe epigastric abdominal pain for several days.

EXAM:
ABDOMEN ULTRASOUND COMPLETE

[Series 1: us abdomen complete · 0.22mm/px · 13 of 104 slices shown]
[im 1/104]
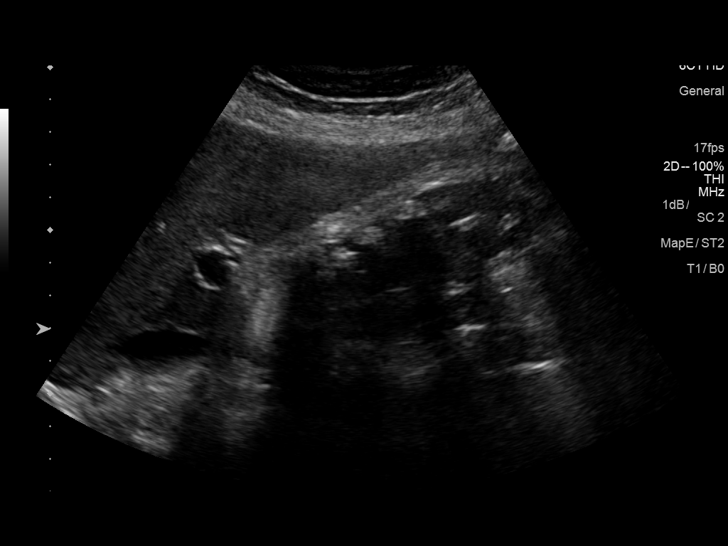
[im 9/104]
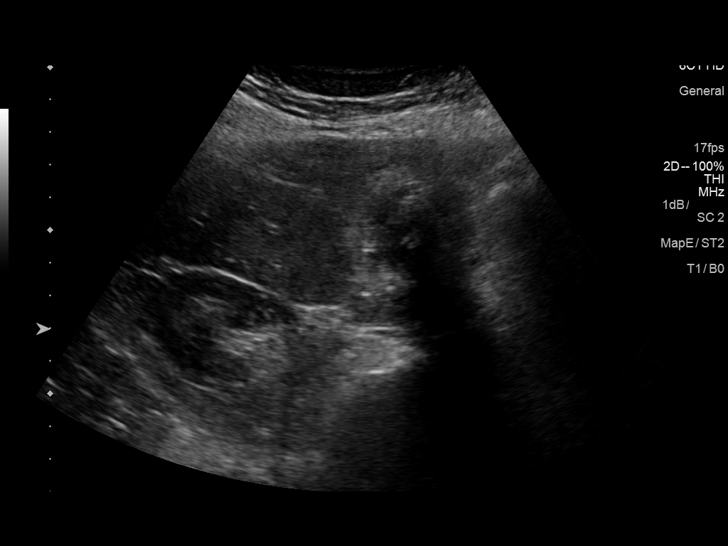
[im 18/104]
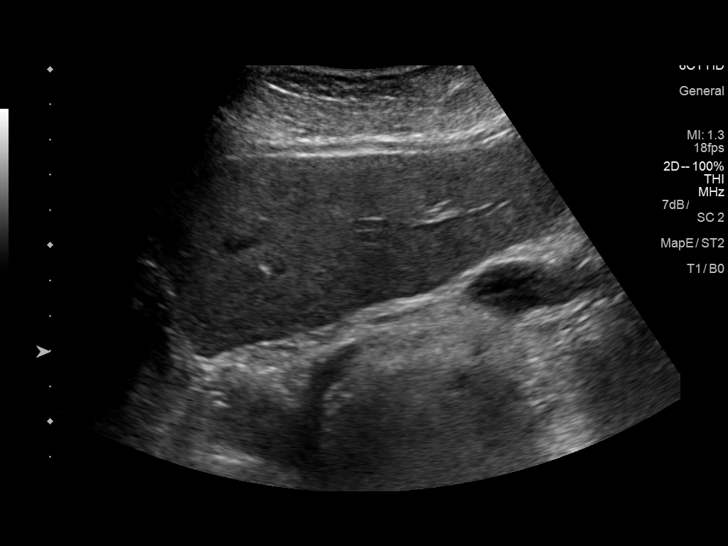
[im 26/104]
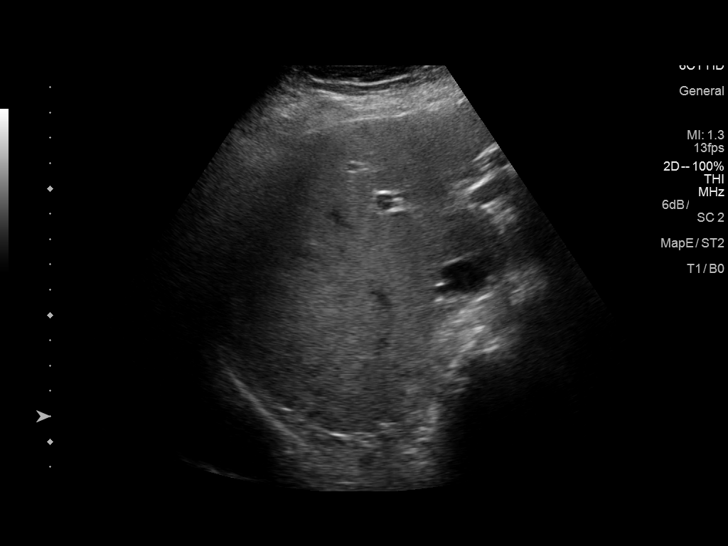
[im 35/104]
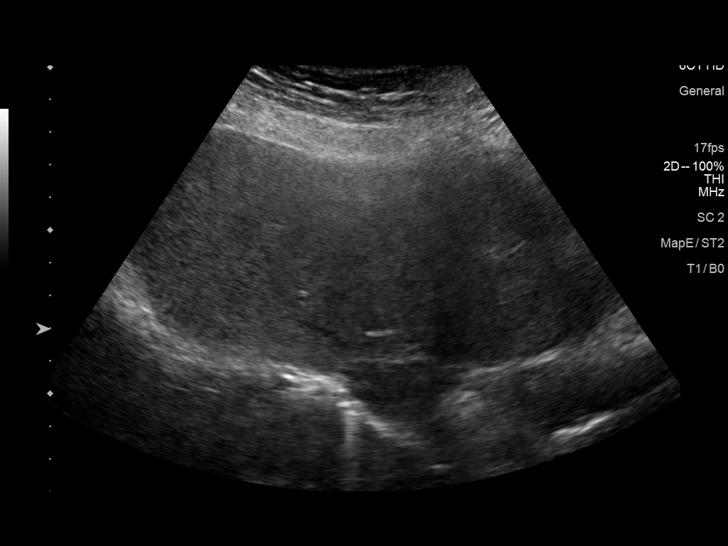
[im 43/104]
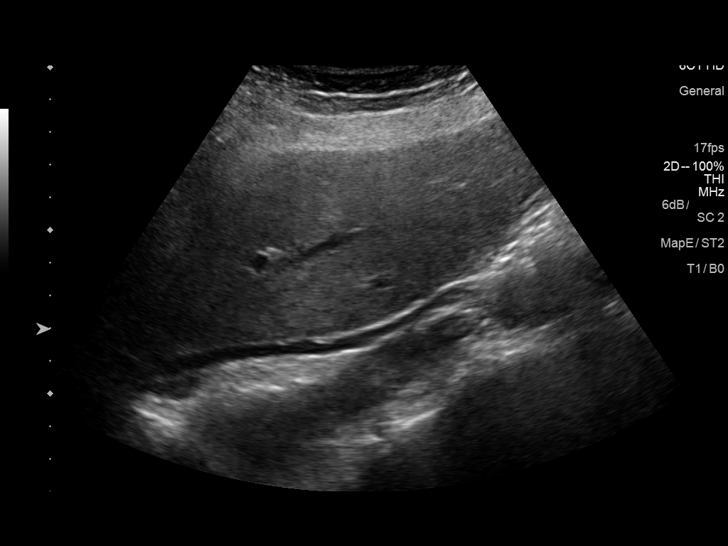
[im 52/104]
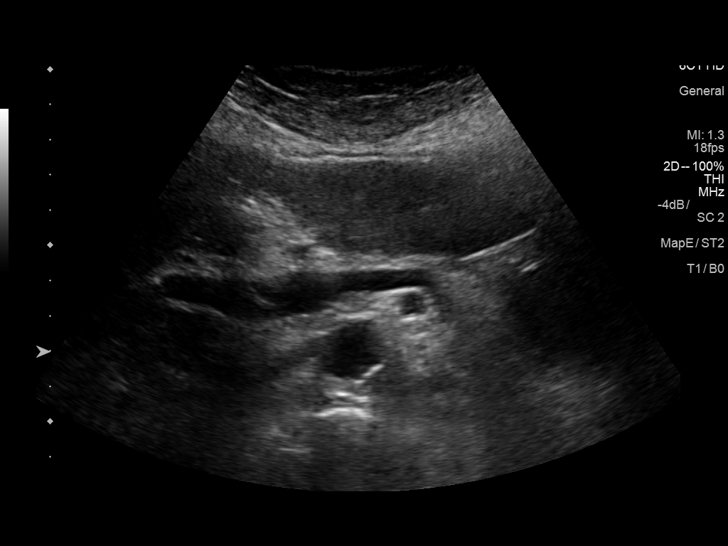
[im 61/104]
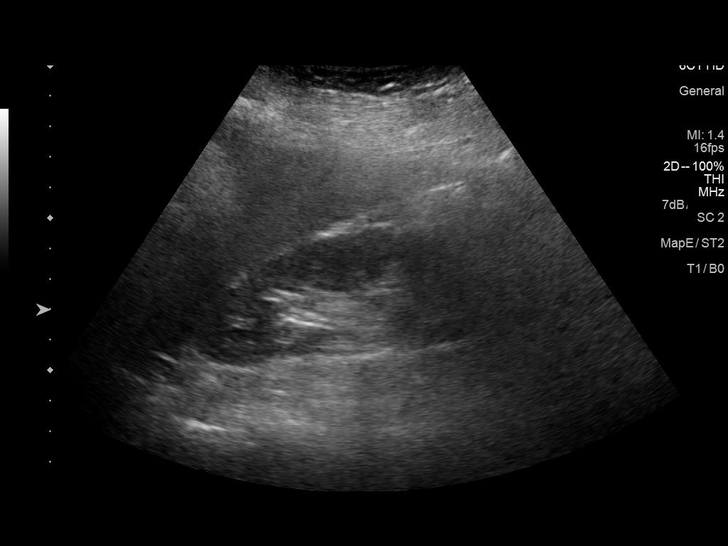
[im 69/104]
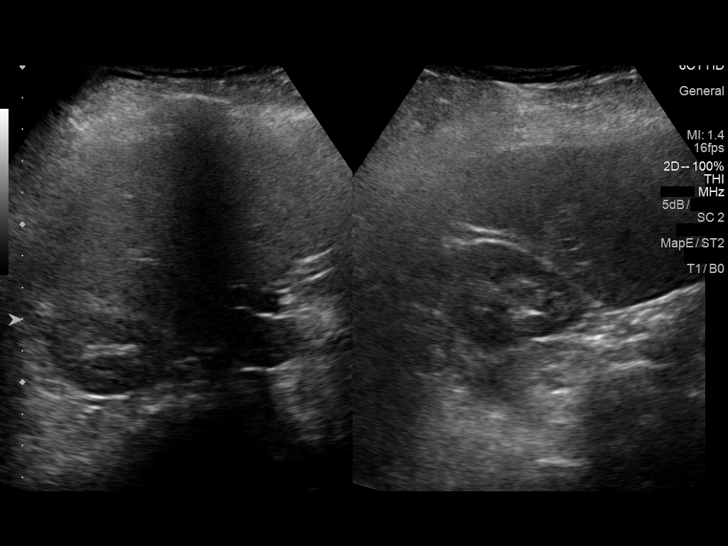
[im 78/104]
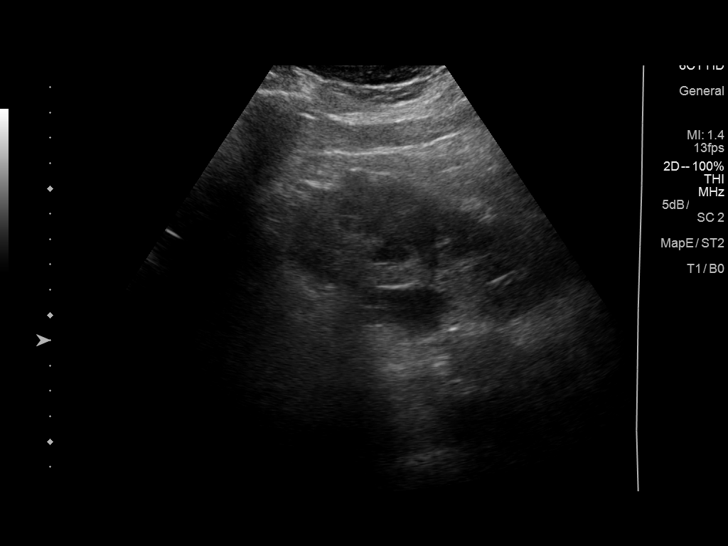
[im 86/104]
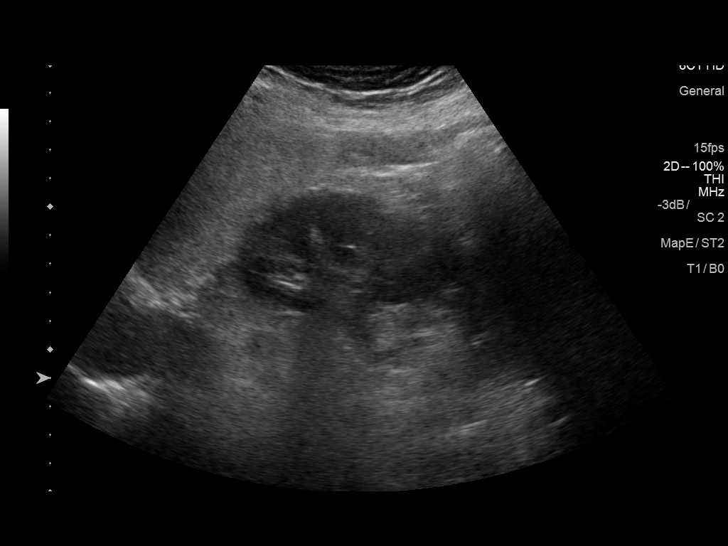
[im 95/104]
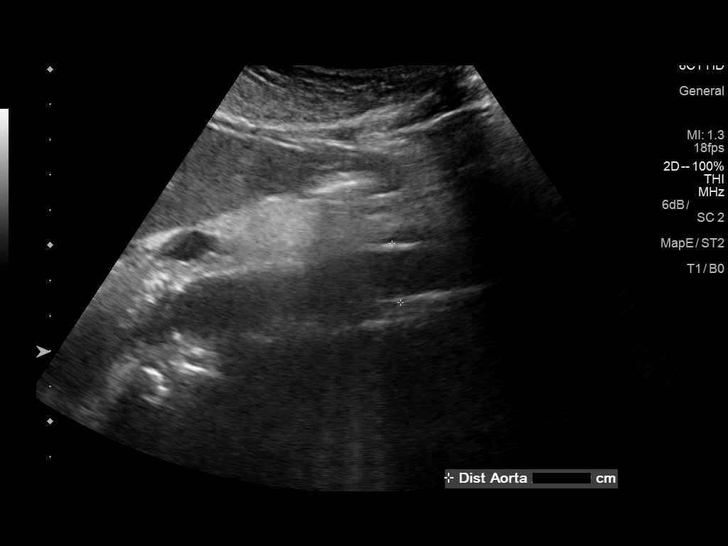
[im 104/104]
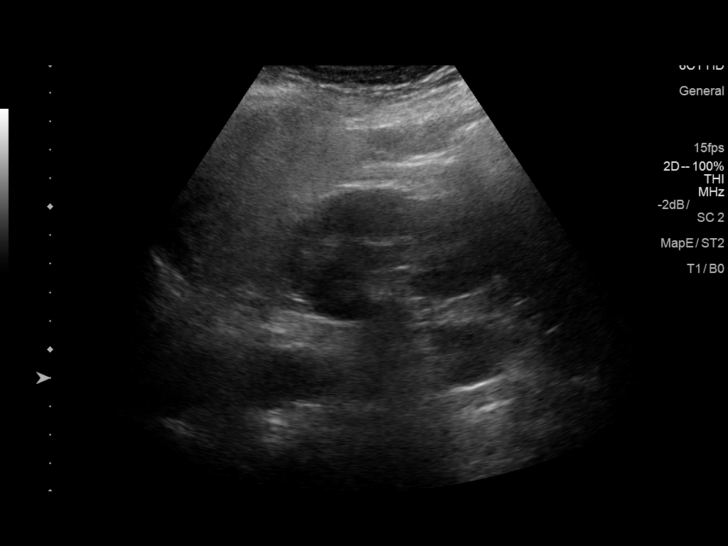

[13 of 25 positions shown; findings below may reference images not displayed]

FINDINGS: Gallbladder: Gallbladder is completely filled with shadowing
gallstones measuring up to 1.3 cm. Diffuse prominent gallbladder
wall thickening (10 mm gallbladder wall thickness). No
pericholecystic fluid or sonographic Murphy's sign.

Common bile duct: Diameter: 3 mm

Liver: No focal lesion identified. Within normal limits in
parenchymal echogenicity. Portal vein is patent on color Doppler
imaging with normal direction of blood flow towards the liver.

IVC: No abnormality visualized.

Pancreas: Visualized portion unremarkable.

Spleen: Size and appearance within normal limits.

Right Kidney: Length: 11.2 cm. Echogenicity within normal limits. No
mass or hydronephrosis visualized.

Left Kidney: Length: 12.3 cm. Echogenicity within normal limits. No
hydronephrosis, noting a few simple parapelvic left renal cysts
measuring up to 3.0 cm in size. Nonobstructing 1.1 cm interpolar
left renal stone.

Abdominal aorta: No aneurysm visualized.

Other findings: None.
IMPRESSION: 1. Gallbladder completely filled with gallstones. Diffuse prominent
gallbladder wall thickening. No pericholecystic fluid or sonographic
Murphy's sign. Although the absence of a sonographic Murphy sign is
most suggestive of chronic cholecystitis, the gallbladder wall
thickening is prominent and hepatobiliary scintigraphy study may be
considered if there is significant clinical concern for acute
cholecystitis.
2. No biliary ductal dilatation.
3. Nonobstructing left nephrolithiasis.

These results will be called to the ordering clinician or
representative by the [HOSPITAL] at the imaging location.

## 2020-03-04 DIAGNOSIS — J449 Chronic obstructive pulmonary disease, unspecified: Secondary | ICD-10-CM | POA: Diagnosis not present

## 2020-03-04 DIAGNOSIS — Z0000 Encounter for general adult medical examination without abnormal findings: Secondary | ICD-10-CM | POA: Diagnosis not present

## 2020-03-04 DIAGNOSIS — Z23 Encounter for immunization: Secondary | ICD-10-CM | POA: Diagnosis not present

## 2020-03-04 DIAGNOSIS — M8588 Other specified disorders of bone density and structure, other site: Secondary | ICD-10-CM | POA: Diagnosis not present

## 2020-03-06 DIAGNOSIS — H524 Presbyopia: Secondary | ICD-10-CM | POA: Diagnosis not present

## 2021-03-29 DIAGNOSIS — Z0000 Encounter for general adult medical examination without abnormal findings: Secondary | ICD-10-CM | POA: Diagnosis not present

## 2021-03-29 DIAGNOSIS — Z23 Encounter for immunization: Secondary | ICD-10-CM | POA: Diagnosis not present

## 2021-03-29 DIAGNOSIS — E7800 Pure hypercholesterolemia, unspecified: Secondary | ICD-10-CM | POA: Diagnosis not present

## 2021-03-29 DIAGNOSIS — M8588 Other specified disorders of bone density and structure, other site: Secondary | ICD-10-CM | POA: Diagnosis not present

## 2021-03-29 DIAGNOSIS — J449 Chronic obstructive pulmonary disease, unspecified: Secondary | ICD-10-CM | POA: Diagnosis not present

## 2021-12-19 DIAGNOSIS — R209 Unspecified disturbances of skin sensation: Secondary | ICD-10-CM | POA: Diagnosis not present

## 2021-12-20 ENCOUNTER — Other Ambulatory Visit (HOSPITAL_COMMUNITY): Payer: Self-pay | Admitting: Family Medicine

## 2021-12-20 ENCOUNTER — Ambulatory Visit (HOSPITAL_COMMUNITY)
Admission: RE | Admit: 2021-12-20 | Discharge: 2021-12-20 | Disposition: A | Discharge status: Home / Self Care | Payer: Medicare Other | Source: Ambulatory Visit | Attending: Vascular Surgery | Admitting: Vascular Surgery

## 2021-12-20 ENCOUNTER — Ambulatory Visit (INDEPENDENT_AMBULATORY_CARE_PROVIDER_SITE_OTHER)
Admission: RE | Admit: 2021-12-20 | Discharge: 2021-12-20 | Disposition: A | Discharge status: Home / Self Care | Payer: Medicare Other | Source: Ambulatory Visit | Attending: Vascular Surgery | Admitting: Vascular Surgery

## 2021-12-20 DIAGNOSIS — R209 Unspecified disturbances of skin sensation: Secondary | ICD-10-CM

## 2021-12-21 ENCOUNTER — Encounter: Payer: Self-pay | Admitting: Vascular Surgery

## 2021-12-21 ENCOUNTER — Ambulatory Visit: Payer: Medicare Other | Admitting: Vascular Surgery

## 2021-12-21 ENCOUNTER — Other Ambulatory Visit: Payer: Self-pay

## 2021-12-21 VITALS — BP 151/83 | HR 86 | Temp 98.2°F | Resp 20 | Ht 65.5 in | Wt 227.0 lb

## 2021-12-21 DIAGNOSIS — I70221 Atherosclerosis of native arteries of extremities with rest pain, right leg: Secondary | ICD-10-CM

## 2021-12-21 NOTE — H&P (View-Only) (Signed)
 Patient ID: [name], female   DOB: 08/11/1950, 70 y.o.   MRN: [id number]  Reason for Consult: No chief complaint on file.   Referred by Ehinger, Robert, MD  Subjective:     HPI:  [name] is a 70 y.o. 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  No previous history of coronary artery disease.  Denies any previous history of stroke, TIA or amaurosis.  No personal family history of aneurysm disease.  She does have right lower extremity short distance claudication and even rest pain in the right.  She also has claudication in the left no rest pain.  Denies tissue loss or ulceration.  She is here today for evaluation of lower extremity arterial disease.  Past Medical History:  Diagnosis Date   COPD (chronic obstructive pulmonary disease) (HCC)    Headache    hx of    Family History  Problem Relation Age of Onset   Healthy Mother    Past Surgical History:  Procedure Laterality Date   CHOLECYSTECTOMY     09-10-17 Dr. Connor   CHOLECYSTECTOMY N/A 09/10/2017   Procedure: LAPAROSCOPIC CHOLECYSTECTOMY;  Surgeon: Connor, Chelsea A, MD;  Location: [facility];  Service: General;  Laterality: N/A;   TUBAL LIGATION      Short Social History:  Social History   Tobacco Use   Smoking status: Every Day    Packs/day: 0.50    Years: 30.00    Total pack years: 15.00    Types: Cigarettes   Smokeless tobacco: Never  Substance Use Topics   Alcohol use: Yes    Alcohol/week: 1.0 standard drink of alcohol    Types: 1 Shots of liquor per week    Comment: 1-2 shots a week    No Known Allergies  Current Outpatient Medications  Medication Sig Dispense Refill   albuterol (VENTOLIN HFA) 108 (90 Base) MCG/ACT inhaler Inhale 1-2 puffs into the lungs every 6 (six) hours as needed for wheezing or shortness of breath. 8 g 1   aspirin EC 81 MG tablet Take 81 mg by mouth daily.     Bioflavonoid Products (VITAMIN C) CHEW Chew 1 tablet by mouth daily.      budesonide-formoterol (SYMBICORT) 160-4.5 MCG/ACT inhaler Inhale 2 puffs into the lungs 2 (two) times daily.     Calcium Carbonate-Vitamin D (CALTRATE 600+D PO) Take 1 tablet by mouth daily.     Misc Natural Products (OSTEO BI-FLEX ADV TRIPLE ST PO) Take 1 tablet by mouth daily.     Multiple Vitamin (MULTIVITAMIN WITH MINERALS) TABS tablet Take 1 tablet by mouth daily. CENTRUM SILVER FOR WOMEN 50+     oxyCODONE-acetaminophen (PERCOCET/ROXICET) 5-325 MG tablet Take 1 tablet by mouth every 6 (six) hours as needed for severe pain. 28 tablet 0   pantoprazole (PROTONIX) 40 MG tablet Take 40 mg by mouth daily before breakfast.  1   TURMERIC PO Take 1 capsule by mouth daily with breakfast.     No current facility-administered medications for this visit.    Review of Systems  Constitutional:  Constitutional negative. HENT: HENT negative.  Eyes: Eyes negative.  Cardiovascular: Positive for claudication.  GI: Gastrointestinal negative.  Musculoskeletal:       Right foot rest pain Neurological: Neurological negative. Hematologic: Hematologic/lymphatic negative.  Psychiatric: Psychiatric negative.        Objective:  Objective   Vitals:   12/21/21 1342  BP: (!) 151/83  Pulse: 86  Resp: 20    Temp: 98.2 F (36.8 C)  SpO2: 94%     Physical Exam HENT:     Head: Normocephalic.     Nose: Nose normal.  Eyes:     Pupils: Pupils are equal, round, and reactive to light.  Neck:     Vascular: No carotid bruit.  Cardiovascular:     Rate and Rhythm: Normal rate.     Comments: There are no reliable lower extremity pulses, femoral pulses are somewhat obscured by body habitus and positioning Abdominal:     General: Abdomen is flat.  Musculoskeletal:     Right lower leg: No edema.     Left lower leg: No edema.  Skin:    General: Skin is warm and dry.     Capillary Refill: Capillary refill takes more than 3 seconds.  Neurological:     Mental Status: She is alert.  Psychiatric:        Mood  and Affect: Mood normal.        Thought Content: Thought content normal.     Data: ABI Findings:  +---------+------------------+-----+----------+--------+  Right    Rt Pressure (mmHg)IndexWaveform  Comment   +---------+------------------+-----+----------+--------+  Brachial 189                    triphasic           +---------+------------------+-----+----------+--------+  PTA      97                0.47 monophasic          +---------+------------------+-----+----------+--------+  DP       0                 0.00 absent              +---------+------------------+-----+----------+--------+  Great Toe47                0.23 Abnormal            +---------+------------------+-----+----------+--------+   +---------+------------------+-----+-------------------+-------+  Left     Lt Pressure (mmHg)IndexWaveform           Comment  +---------+------------------+-----+-------------------+-------+  Brachial 205                    triphasic                   +---------+------------------+-----+-------------------+-------+  PTA      137               0.67 dampened monophasic         +---------+------------------+-----+-------------------+-------+  DP       122               0.60 monophasic                  +---------+------------------+-----+-------------------+-------+  Great Toe100               0.49 Abnormal                    +---------+------------------+-----+-------------------+-------+   RIGHT      PSV cm/sRatioStenosisWaveform  Comments                         +-----------+--------+-----+--------+----------+---------------------------  ---+  POP Prox   51                   triphasic                                  +-----------+--------+-----+--------+----------+---------------------------  ---+  ATA Distal 6                    monophasicseverely blunted  monophasic                                                flow.                             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            Assessment/Plan:    71 year old female with severely depressed ABIs on the right with rest pain and claudication on the left.  Appears to have tibial disease by recent duplex.  Plan will be for aortogram with lower extremity angiography from a left common femoral approach focusing on the right side on a Monday in the near future.  I discussed the risk benefits alternatives as well as the need to have someone stay with her after the procedure.  We will get her scheduled in the near future.     Maeola Harman MD [facility]

## 2021-12-21 NOTE — Progress Notes (Signed)
Patient ID: [name], female   DOB: 12/31/1950, 70 y.o.   MRN: [id number]  Reason for Consult: No chief complaint on file.   Referred by Blair Heys, MD  Subjective:     HPI:  [name] is a 71 y.o. 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  No previous history of coronary artery disease.  Denies any previous history of stroke, TIA or amaurosis.  No personal family history of aneurysm disease.  She does have right lower extremity short distance claudication and even rest pain in the right.  She also has claudication in the left no rest pain.  Denies tissue loss or ulceration.  She is here today for evaluation of lower extremity arterial disease.  Past Medical History:  Diagnosis Date   COPD (chronic obstructive pulmonary disease) (HCC)    Headache    hx of    Family History  Problem Relation Age of Onset   Healthy Mother    Past Surgical History:  Procedure Laterality Date   CHOLECYSTECTOMY     09-10-17 Dr. Fredricka Bonine   CHOLECYSTECTOMY N/A 09/10/2017   Procedure: LAPAROSCOPIC CHOLECYSTECTOMY;  Surgeon: Berna Bue, MD;  Location: [facility];  Service: General;  Laterality: N/A;   TUBAL LIGATION      Short Social History:  Social History   Tobacco Use   Smoking status: Every Day    Packs/day: 0.50    Years: 30.00    Total pack years: 15.00    Types: Cigarettes   Smokeless tobacco: Never  Substance Use Topics   Alcohol use: Yes    Alcohol/week: 1.0 standard drink of alcohol    Types: 1 Shots of liquor per week    Comment: 1-2 shots a week    No Known Allergies  Current Outpatient Medications  Medication Sig Dispense Refill   albuterol (VENTOLIN HFA) 108 (90 Base) MCG/ACT inhaler Inhale 1-2 puffs into the lungs every 6 (six) hours as needed for wheezing or shortness of breath. 8 g 1   aspirin EC 81 MG tablet Take 81 mg by mouth daily.     Bioflavonoid Products (VITAMIN C) CHEW Chew 1 tablet by mouth daily.      budesonide-formoterol (SYMBICORT) 160-4.5 MCG/ACT inhaler Inhale 2 puffs into the lungs 2 (two) times daily.     Calcium Carbonate-Vitamin D (CALTRATE 600+D PO) Take 1 tablet by mouth daily.     Misc Natural Products (OSTEO BI-FLEX ADV TRIPLE ST PO) Take 1 tablet by mouth daily.     Multiple Vitamin (MULTIVITAMIN WITH MINERALS) TABS tablet Take 1 tablet by mouth daily. CENTRUM SILVER FOR WOMEN 50+     oxyCODONE-acetaminophen (PERCOCET/ROXICET) 5-325 MG tablet Take 1 tablet by mouth every 6 (six) hours as needed for severe pain. 28 tablet 0   pantoprazole (PROTONIX) 40 MG tablet Take 40 mg by mouth daily before breakfast.  1   TURMERIC PO Take 1 capsule by mouth daily with breakfast.     No current facility-administered medications for this visit.    Review of Systems  Constitutional:  Constitutional negative. HENT: HENT negative.  Eyes: Eyes negative.  Cardiovascular: Positive for claudication.  GI: Gastrointestinal negative.  Musculoskeletal:       Right foot rest pain Neurological: Neurological negative. Hematologic: Hematologic/lymphatic negative.  Psychiatric: Psychiatric negative.        Objective:  Objective   Vitals:   12/21/21 1342  BP: (!) 151/83  Pulse: 86  Resp: 20  Temp: 98.2 F (36.8 C)  SpO2: 94%     Physical Exam HENT:     Head: Normocephalic.     Nose: Nose normal.  Eyes:     Pupils: Pupils are equal, round, and reactive to light.  Neck:     Vascular: No carotid bruit.  Cardiovascular:     Rate and Rhythm: Normal rate.     Comments: There are no reliable lower extremity pulses, femoral pulses are somewhat obscured by body habitus and positioning Abdominal:     General: Abdomen is flat.  Musculoskeletal:     Right lower leg: No edema.     Left lower leg: No edema.  Skin:    General: Skin is warm and dry.     Capillary Refill: Capillary refill takes more than 3 seconds.  Neurological:     Mental Status: She is alert.  Psychiatric:        Mood  and Affect: Mood normal.        Thought Content: Thought content normal.     Data: ABI Findings:  +---------+------------------+-----+----------+--------+  Right    Rt Pressure (mmHg)IndexWaveform  Comment   +---------+------------------+-----+----------+--------+  Brachial 189                    triphasic           +---------+------------------+-----+----------+--------+  PTA      97                0.47 monophasic          +---------+------------------+-----+----------+--------+  DP       0                 0.00 absent              +---------+------------------+-----+----------+--------+  Great Toe47                0.23 Abnormal            +---------+------------------+-----+----------+--------+   +---------+------------------+-----+-------------------+-------+  Left     Lt Pressure (mmHg)IndexWaveform           Comment  +---------+------------------+-----+-------------------+-------+  Brachial 205                    triphasic                   +---------+------------------+-----+-------------------+-------+  PTA      137               0.67 dampened monophasic         +---------+------------------+-----+-------------------+-------+  DP       122               0.60 monophasic                  +---------+------------------+-----+-------------------+-------+  Great Toe100               0.49 Abnormal                    +---------+------------------+-----+-------------------+-------+   RIGHT      PSV cm/sRatioStenosisWaveform  Comments                         +-----------+--------+-----+--------+----------+---------------------------  ---+  POP Prox   51                   triphasic                                  +-----------+--------+-----+--------+----------+---------------------------  ---+  ATA Distal 6                    monophasicseverely blunted  monophasic                                                flow.                             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            Assessment/Plan:    71 year old female with severely depressed ABIs on the right with rest pain and claudication on the left.  Appears to have tibial disease by recent duplex.  Plan will be for aortogram with lower extremity angiography from a left common femoral approach focusing on the right side on a Monday in the near future.  I discussed the risk benefits alternatives as well as the need to have someone stay with her after the procedure.  We will get her scheduled in the near future.     Maeola Harman MD [facility]

## 2021-12-26 ENCOUNTER — Ambulatory Visit (HOSPITAL_COMMUNITY)
Admission: RE | Admit: 2021-12-26 | Discharge: 2021-12-26 | Disposition: A | Discharge status: Home / Self Care | Payer: Medicare Other | Attending: Vascular Surgery | Admitting: Vascular Surgery

## 2021-12-26 ENCOUNTER — Telehealth: Payer: Self-pay | Admitting: Vascular Surgery

## 2021-12-26 ENCOUNTER — Other Ambulatory Visit: Payer: Self-pay

## 2021-12-26 ENCOUNTER — Encounter (HOSPITAL_COMMUNITY)
Admission: RE | Disposition: A | Discharge status: Home / Self Care | Payer: Self-pay | Source: Home / Self Care | Attending: Vascular Surgery

## 2021-12-26 DIAGNOSIS — I70221 Atherosclerosis of native arteries of extremities with rest pain, right leg: Secondary | ICD-10-CM | POA: Diagnosis not present

## 2021-12-26 DIAGNOSIS — J449 Chronic obstructive pulmonary disease, unspecified: Secondary | ICD-10-CM | POA: Insufficient documentation

## 2021-12-26 DIAGNOSIS — Z87891 Personal history of nicotine dependence: Secondary | ICD-10-CM | POA: Insufficient documentation

## 2021-12-26 HISTORY — PX: ABDOMINAL AORTOGRAM W/LOWER EXTREMITY: CATH118223

## 2021-12-26 LAB — POCT I-STAT, CHEM 8
BUN: 11 mg/dL (ref 8–23)
Calcium, Ion: 1.06 mmol/L — ABNORMAL LOW (ref 1.15–1.40)
Chloride: 105 mmol/L (ref 98–111)
Creatinine, Ser: 0.7 mg/dL (ref 0.44–1.00)
Glucose, Bld: 97 mg/dL (ref 70–99)
HCT: 43 % (ref 36.0–46.0)
Hemoglobin: 14.6 g/dL (ref 12.0–15.0)
Potassium: 4.1 mmol/L (ref 3.5–5.1)
Sodium: 139 mmol/L (ref 135–145)
TCO2: 27 mmol/L (ref 22–32)

## 2021-12-26 SURGERY — ABDOMINAL AORTOGRAM W/LOWER EXTREMITY
Anesthesia: LOCAL | Laterality: Bilateral

## 2021-12-26 MED ORDER — SODIUM CHLORIDE 0.9 % IV SOLN
250.0000 mL | INTRAVENOUS | Status: DC | PRN
Start: 2021-12-27 — End: 2021-12-27

## 2021-12-26 MED ORDER — HEPARIN (PORCINE) IN NACL 1000-0.9 UT/500ML-% IV SOLN
INTRAVENOUS | Status: AC
Start: 2021-12-26 — End: ?
  Filled 2021-12-26: qty 1000

## 2021-12-26 MED ORDER — ROSUVASTATIN CALCIUM 5 MG PO TABS
10.0000 mg | ORAL_TABLET | Freq: Every day | ORAL | Status: DC
  Administered 2021-12-26: 10 mg via ORAL
  Filled 2021-12-26: qty 1
  Filled 2021-12-26: qty 2

## 2021-12-26 MED ORDER — OXYCODONE HCL 5 MG PO TABS: 5.0000 mg | ORAL_TABLET | ORAL | Status: DC | PRN

## 2021-12-26 MED ORDER — HYDRALAZINE HCL 20 MG/ML IJ SOLN
INTRAMUSCULAR | Status: DC | PRN
  Administered 2021-12-26: 10 mg via INTRAVENOUS

## 2021-12-26 MED ORDER — IODIXANOL 320 MG/ML IV SOLN
INTRAVENOUS | Status: DC | PRN
  Administered 2021-12-26: 125 mL

## 2021-12-26 MED ORDER — MIDAZOLAM HCL 2 MG/2ML IJ SOLN
INTRAMUSCULAR | Status: AC
  Filled 2021-12-26: qty 2

## 2021-12-26 MED ORDER — SODIUM CHLORIDE 0.9 % WEIGHT BASED INFUSION: 1.0000 mL/kg/h | INTRAVENOUS | Status: DC

## 2021-12-26 MED ORDER — MIDAZOLAM HCL 2 MG/2ML IJ SOLN
INTRAMUSCULAR | Status: DC | PRN
  Administered 2021-12-26: 1 mg via INTRAVENOUS

## 2021-12-26 MED ORDER — LIDOCAINE HCL (PF) 1 % IJ SOLN
INTRAMUSCULAR | Status: DC | PRN
  Administered 2021-12-26: 18 mL

## 2021-12-26 MED ORDER — LIDOCAINE HCL (PF) 1 % IJ SOLN
INTRAMUSCULAR | Status: AC
Start: 2021-12-26 — End: ?
  Filled 2021-12-26: qty 30

## 2021-12-26 MED ORDER — SODIUM CHLORIDE 0.9 % IV SOLN: INTRAVENOUS | Status: DC

## 2021-12-26 MED ORDER — MORPHINE SULFATE (PF) 2 MG/ML IV SOLN: 2.0000 mg | INTRAVENOUS | Status: DC | PRN

## 2021-12-26 MED ORDER — HYDRALAZINE HCL 20 MG/ML IJ SOLN: 5.0000 mg | INTRAMUSCULAR | Status: DC | PRN

## 2021-12-26 MED ORDER — HYDRALAZINE HCL 20 MG/ML IJ SOLN
INTRAMUSCULAR | Status: AC
  Filled 2021-12-26: qty 1

## 2021-12-26 MED ORDER — FENTANYL CITRATE (PF) 100 MCG/2ML IJ SOLN
INTRAMUSCULAR | Status: DC | PRN
  Administered 2021-12-26: 50 ug via INTRAVENOUS

## 2021-12-26 MED ORDER — LABETALOL HCL 5 MG/ML IV SOLN: 10.0000 mg | INTRAVENOUS | Status: DC | PRN

## 2021-12-26 MED ORDER — ROSUVASTATIN CALCIUM 10 MG PO TABS: 10.0000 mg | ORAL_TABLET | Freq: Every day | ORAL | 11 refills | Status: DC

## 2021-12-26 MED ORDER — ACETAMINOPHEN 325 MG PO TABS: 650.0000 mg | ORAL_TABLET | ORAL | Status: DC | PRN

## 2021-12-26 MED ORDER — FENTANYL CITRATE (PF) 100 MCG/2ML IJ SOLN
INTRAMUSCULAR | Status: AC
  Filled 2021-12-26: qty 2

## 2021-12-26 MED ORDER — SODIUM CHLORIDE 0.9% FLUSH: 3.0000 mL | Freq: Two times a day (BID) | INTRAVENOUS | Status: DC

## 2021-12-26 MED ORDER — SODIUM CHLORIDE 0.9% FLUSH
3.0000 mL | INTRAVENOUS | Status: DC | PRN
Start: 2021-12-27 — End: 2021-12-27

## 2021-12-26 MED ORDER — HEPARIN (PORCINE) IN NACL 1000-0.9 UT/500ML-% IV SOLN
INTRAVENOUS | Status: DC | PRN
  Administered 2021-12-26 (×2): 500 mL

## 2021-12-26 MED ORDER — ONDANSETRON HCL 4 MG/2ML IJ SOLN: 4.0000 mg | Freq: Four times a day (QID) | INTRAMUSCULAR | Status: DC | PRN

## 2021-12-26 SURGICAL SUPPLY — 11 items
CATH OMNI FLUSH 5F 65CM (CATHETERS) IMPLANT
CATH TEMPO AQUA 5F 100CM (CATHETERS) IMPLANT
CLOSURE MYNX CONTROL 5F (Vascular Products) IMPLANT
KIT MICROPUNCTURE NIT STIFF (SHEATH) IMPLANT
KIT PV (KITS) ×1 IMPLANT
SHEATH PINNACLE 5F 10CM (SHEATH) IMPLANT
SHEATH PROBE COVER 6X72 (BAG) IMPLANT
SYR MEDRAD MARK V 150ML (SYRINGE) IMPLANT
TRANSDUCER W/STOPCOCK (MISCELLANEOUS) ×1 IMPLANT
TRAY PV CATH (CUSTOM PROCEDURE TRAY) ×1 IMPLANT
WIRE BENTSON .035X145CM (WIRE) IMPLANT

## 2021-12-26 NOTE — Op Note (Signed)
    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 Randie Heinz, MD Procedure Performed: 1.  Ultrasound-guided cannulation left common femoral artery 2.  Aortogram with bilateral lower extremity runoff 3.  Stent of right popliteal artery and below the knee angiography on the right 4.  Mynx device closure left common femoral artery 5.  Moderate sedation with fentanyl and Versed for 32 minutes   Indications: 71 year old female with long history of smoking now quit 2 years ago.  She has rest pain on the right lower extremity thankfully no ulceration.  She does have severely depressed ABIs on the right with diminished toe pressure of 47 is indicated for angiography with possible intervention.  Findings: The aorta and iliac segments are free of flow-limiting stenosis as her bilateral renal arteries.  The right-sided site of interest common femoral and SFA are patent down to the level of the above-knee popliteal artery where she has long segment occlusion and reconstitutes what appears to be posterior tibial artery is the dominant runoff into the foot filling the arch.  She does have a distal peroneal and anterior tibial artery but would not be suitable for bypass.  On the left side she has patent left common femoral artery and SFA and popliteal arteries and trifurcation is patent but there was washout prior to evaluation of the foot given the slow timing.  Patient will need consideration of right above-the-knee popliteal to posterior tibial artery bypass if she has continued pain.  She will follow-up in a couple months to monitor her progression and statins have been initiated today and she was started on aspirin in the office.   Procedure:  The patient was identified in the holding area and taken to room 8.  The patient was  then placed supine on the table and prepped and draped in the usual sterile fashion.  A time out was called.  Ultrasound was used to evaluate the left common femoral artery which was free of disease.  The area was anesthetized 1% lidocaine and cannulated micropuncture needle followed by wire and sheath.  Images saved the permanent record.  Concomitant moderate sedation was administered with fentanyl and Versed and her vital signs were monitored throughout the case.  We placed a Bentson wire followed by 5 [place] sheath and an Omni catheter to the level of L1 performed aortogram followed by bilateral lower extremity runoff.  With the above findings we crossed the bifurcation Bentson wire and a straight catheter down to the level of the above-knee popliteal artery and perform dedicated angiography below the knee which demonstrated posterior tibial is the dominant runoff and patient would be above the knee to posterior tibial artery bypass if possible in the future.  Straight catheter was removed over the wire and minx device deployed.  She tolerated procedure well without any complication.   Contrast: 125cc  Keanen Dohse C. Randie Heinz, MD [facility] [place] Office: [phone] Pager: [phone]

## 2021-12-26 NOTE — Interval H&P Note (Signed)
History and Physical Interval Note:  12/26/2021 11:29 AM  [name]  has presented today for surgery, with the diagnosis of ischemia with rest pain.  The various methods of treatment have been discussed with the patient and family. After consideration of risks, benefits and other options for treatment, the patient has consented to  Procedure(s): ABDOMINAL AORTOGRAM W/LOWER EXTREMITY (N/A) as a surgical intervention.  The patient's history has been reviewed, patient examined, no change in status, stable for surgery.  I have reviewed the patient's chart and labs.  Questions were answered to the patient's satisfaction.     Lemar Livings

## 2021-12-26 NOTE — Telephone Encounter (Signed)
-----   Message from Maeola Harman, MD sent at 12/26/2021  3:38 PM EDT ----- [name] [phone] 02/22/1951  Follow-up in about 2 months with me with bilateral lower extremity greater saphenous vein mapping and ABIs.  [name]

## 2021-12-27 ENCOUNTER — Encounter (HOSPITAL_COMMUNITY): Payer: Self-pay | Admitting: Vascular Surgery

## 2022-01-12 NOTE — Telephone Encounter (Signed)
Appt has been scheduled.

## 2022-02-14 ENCOUNTER — Other Ambulatory Visit: Payer: Self-pay | Admitting: *Deleted

## 2022-02-14 DIAGNOSIS — I70221 Atherosclerosis of native arteries of extremities with rest pain, right leg: Secondary | ICD-10-CM

## 2022-02-22 ENCOUNTER — Ambulatory Visit (INDEPENDENT_AMBULATORY_CARE_PROVIDER_SITE_OTHER): Payer: Medicare Other | Admitting: Vascular Surgery

## 2022-02-22 ENCOUNTER — Encounter: Payer: Self-pay | Admitting: Vascular Surgery

## 2022-02-22 ENCOUNTER — Ambulatory Visit (HOSPITAL_COMMUNITY)
Admission: RE | Admit: 2022-02-22 | Discharge: 2022-02-22 | Disposition: A | Discharge status: Home / Self Care | Payer: Medicare Other | Source: Ambulatory Visit | Attending: Vascular Surgery | Admitting: Vascular Surgery

## 2022-02-22 ENCOUNTER — Ambulatory Visit (INDEPENDENT_AMBULATORY_CARE_PROVIDER_SITE_OTHER)
Admission: RE | Admit: 2022-02-22 | Discharge: 2022-02-22 | Disposition: A | Discharge status: Home / Self Care | Payer: Medicare Other | Source: Ambulatory Visit | Attending: Vascular Surgery | Admitting: Vascular Surgery

## 2022-02-22 VITALS — BP 154/88 | HR 85 | Temp 98.0°F | Resp 20 | Ht 65.0 in | Wt 230.0 lb

## 2022-02-22 DIAGNOSIS — I70221 Atherosclerosis of native arteries of extremities with rest pain, right leg: Secondary | ICD-10-CM | POA: Insufficient documentation

## 2022-02-22 NOTE — Progress Notes (Signed)
Patient ID: [name], female   DOB: [birth year], 71 y.o.   MRN: [id number]  Reason for Consult: No chief complaint on file.   Referred by Irven Coe, MD  Subjective:     HPI:  [name] is a 71 y.o. female had rest pain in the right lower extremity and underwent diagnostic angiography.  At that time she was having difficulty even walking through the grocery store.  Since the diagnostic angiography she is actually much better particularly on the right lower extremity and denies any pain or claudication at this time.  She is here for repeat ABIs and lower extremity saphenous vein mapping.  She continues on aspirin and has also begun taking a statin.  Past Medical History:  Diagnosis Date   COPD (chronic obstructive pulmonary disease) (HCC)    Headache    hx of    Family History  Problem Relation Age of Onset   Healthy Mother    Past Surgical History:  Procedure Laterality Date   ABDOMINAL AORTOGRAM W/LOWER EXTREMITY Bilateral 12/26/2021   Procedure: ABDOMINAL AORTOGRAM W/LOWER EXTREMITY;  Surgeon: Maeola Harman, MD;  Location: [facility] INV[facility];  Service: Cardiovascular;  Laterality: Bilateral;   CHOLECYSTECTOMY     09-10-17 Dr. Fredricka Bonine   CHOLECYSTECTOMY N/A 09/10/2017   Procedure: LAPAROSCOPIC CHOLECYSTECTOMY;  Surgeon: Berna Bue, MD;  Location: [facility] ORS;  Service: General;  Laterality: N/A;   TUBAL LIGATION      Short Social History:  Social History   Tobacco Use   Smoking status: Former    Packs/day: 0.50    Years: 30.00    Total pack years: 15.00    Types: Cigarettes    Quit date: 2021    Years since quitting: 2.8   Smokeless tobacco: Never  Substance Use Topics   Alcohol use: Yes    Alcohol/week: 1.0 standard drink of alcohol    Types: 1 Shots of liquor per week    Comment: 1-2 shots a week    No Known Allergies  Current Outpatient Medications  Medication Sig Dispense Refill   albuterol (VENTOLIN HFA) 108 (90 Base) MCG/ACT inhaler  Inhale 1-2 puffs into the lungs every 6 (six) hours as needed for wheezing or shortness of breath. 8 g 1   aspirin EC 81 MG tablet Take 81 mg by mouth daily.     Bacitracin-Polymyxin B (NEOSPORIN EX) Apply 1 Application topically daily as needed (wound care).     Bioflavonoid Products (VITAMIN C) CHEW Chew 1 tablet by mouth daily.     BREZTRI AEROSPHERE 160-9-4.8 MCG/ACT AERO Inhale 2 puffs into the lungs 2 (two) times daily.     Calcium Carbonate-Vitamin D (CALTRATE 600+D PO) Take 1 tablet by mouth daily.     guaiFENesin (MUCINEX) 600 MG 12 hr tablet Take 600 mg by mouth daily as needed (congestion).     ibuprofen (ADVIL) 200 MG tablet Take 200 mg by mouth every 6 (six) hours as needed for moderate pain.     Melatonin 10 MG TABS Take 20 mg by mouth at bedtime.     Misc Natural Products (OSTEO BI-FLEX ADV TRIPLE ST PO) Take 1 tablet by mouth daily.     Multiple Vitamin (MULTIVITAMIN WITH MINERALS) TABS tablet Take 1 tablet by mouth daily. CENTRUM SILVER FOR WOMEN 50+     rosuvastatin (CRESTOR) 10 MG tablet Take 1 tablet (10 mg total) by mouth daily. 30 tablet 11   No current facility-administered medications for this visit.  Review of Systems  Constitutional:  Constitutional negative. HENT: HENT negative.  Eyes: Eyes negative.  Cardiovascular: Negative for claudication.  GI: Gastrointestinal negative.  Musculoskeletal: Negative for gait problem and leg pain.       Right foot rest pain Skin: Skin negative.  Neurological: Neurological negative. Hematologic: Hematologic/lymphatic negative.  Psychiatric: Psychiatric negative.        Objective:  Objective  Vitals:   02/22/22 1140  BP: (!) 154/88  Pulse: 85  Resp: 20  Temp: 98 F (36.7 C)  SpO2: 91%     Physical Exam HENT:     Head: Normocephalic.     Nose: Nose normal.  Eyes:     Pupils: Pupils are equal, round, and reactive to light.  Cardiovascular:     Rate and Rhythm: Normal rate.     Pulses:          Femoral  pulses are 2+ on the right side and 2+ on the left side.      Popliteal pulses are 0 on the right side and 0 on the left side.     Comments: There is now a very strong posterior tibial signal bilaterally Abdominal:     General: Abdomen is flat.     Palpations: Abdomen is soft.  Musculoskeletal:     Right lower leg: No edema.     Left lower leg: No edema.  Skin:    General: Skin is warm and dry.     Capillary Refill: Capillary refill takes less than 2 seconds.  Neurological:     General: No focal deficit present.     Mental Status: She is alert.  Psychiatric:        Mood and Affect: Mood normal.        Behavior: Behavior normal.        Thought Content: Thought content normal.     Data: ABI Findings:  +---------+------------------+-----+---------+--------+  Right    Rt Pressure (mmHg)IndexWaveform Comment   +---------+------------------+-----+---------+--------+  Brachial 177                                       +---------+------------------+-----+---------+--------+  PTA      194               1.08 triphasic          +---------+------------------+-----+---------+--------+  DP       152               0.85 biphasic           +---------+------------------+-----+---------+--------+  Great Toe129               0.72                    +---------+------------------+-----+---------+--------+   +---------+------------------+-----+--------+-------+  Left     Lt Pressure (mmHg)IndexWaveformComment  +---------+------------------+-----+--------+-------+  Brachial 179                                     +---------+------------------+-----+--------+-------+  PTA      216               1.21 biphasic         +---------+------------------+-----+--------+-------+  DP       180               1.01  biphasic         +---------+------------------+-----+--------+-------+  Great Toe121               0.68                   +---------+------------------+-----+--------+-------+   +-------+-----------+-----------+------------+------------+  ABI/TBIToday's ABIToday's TBIPrevious ABIPrevious TBI  +-------+-----------+-----------+------------+------------+  Right  1.08       0.72       0.47        absent        +-------+-----------+-----------+------------+------------+  Left   1.21       0.68       0.67        0.49          +-------+-----------+-----------+------------+------------+      RT Diameter RT Findings           GSV           LT Diameter  LT  Findings       (cm)                                            (cm)                     +-------------+------------+-----------------------+-------------+---------  ----+      0.76                 Saphenofemoral Junction    0.53                     +-------------+------------+-----------------------+-------------+---------  ----+      0.39                     Proximal thigh         0.34                     +-------------+------------+-----------------------+-------------+---------  ----+      0.33                        Mid thigh           0.31         wall                                                                       thickening    +-------------+------------+-----------------------+-------------+---------  ----+      0.34                      Distal thigh          0.34                     +-------------+------------+-----------------------+-------------+---------  ----+      0.36        out of            Knee              0.34  fascia and                                                                     splits                                                       +-------------+------------+-----------------------+-------------+---------  ----+                                  Prox calf           0.35                      +-------------+------------+-----------------------+-------------+---------  ----+                                  Mid calf            0.23                     +-------------+------------+-----------------------+-------------+---------  ----+                                 Distal calf          0.23                     +-------------+------------+-----------------------+-------------+---------  ----+    Summary:     Right: Patent great saphenous vein with measurements as described         above.  Left: Patent great saphenous vein with measurements as described        above.        Thickened wall in the mid thigh most compatible with the        sequela of old thrombus.       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  Her ABIs today are much improved originally thought was not correct but her symptoms have also improved and her signal in the posterior tibial artery sounds as though there is inline flow although I cannot reliably palpate a posterior tibial pulse right foot does have brisk capillary refill.  The original op note says that she had a stent but I confirmed that she did not have a stent by reviewing all of the images this was a misnomer that should have said selection of the right popliteal artery and I have notified the coders of this.  I cleared up with the patient that she indeed does not have a stent I cannot rectify why her ABIs improved from her symptoms improved with no intervention but she has improved ABIs bilaterally and now is free of symptoms.  As such she will follow-up  in 6 months with repeat lower extremity duplexes and she will continue aspirin and a statin.     Maeola HarmanBrandon Christopher Larken Urias MD [facility]

## 2022-02-24 ENCOUNTER — Other Ambulatory Visit: Payer: Self-pay

## 2022-02-24 DIAGNOSIS — I70221 Atherosclerosis of native arteries of extremities with rest pain, right leg: Secondary | ICD-10-CM

## 2022-04-06 DIAGNOSIS — Z0000 Encounter for general adult medical examination without abnormal findings: Secondary | ICD-10-CM | POA: Diagnosis not present

## 2022-04-06 DIAGNOSIS — R0602 Shortness of breath: Secondary | ICD-10-CM | POA: Diagnosis not present

## 2022-04-06 DIAGNOSIS — E7800 Pure hypercholesterolemia, unspecified: Secondary | ICD-10-CM | POA: Diagnosis not present

## 2022-04-06 DIAGNOSIS — R809 Proteinuria, unspecified: Secondary | ICD-10-CM | POA: Diagnosis not present

## 2022-04-06 DIAGNOSIS — J449 Chronic obstructive pulmonary disease, unspecified: Secondary | ICD-10-CM | POA: Diagnosis not present

## 2022-04-06 DIAGNOSIS — M8588 Other specified disorders of bone density and structure, other site: Secondary | ICD-10-CM | POA: Diagnosis not present

## 2022-04-11 ENCOUNTER — Other Ambulatory Visit: Payer: Self-pay | Admitting: Family Medicine

## 2022-04-11 ENCOUNTER — Ambulatory Visit
Admission: RE | Admit: 2022-04-11 | Discharge: 2022-04-11 | Disposition: A | Discharge status: Home / Self Care | Payer: Medicare Other | Source: Ambulatory Visit | Attending: Family Medicine | Admitting: Family Medicine

## 2022-04-11 DIAGNOSIS — R0602 Shortness of breath: Secondary | ICD-10-CM

## 2022-04-11 DIAGNOSIS — Z1211 Encounter for screening for malignant neoplasm of colon: Secondary | ICD-10-CM | POA: Diagnosis not present

## 2022-04-11 DIAGNOSIS — R059 Cough, unspecified: Secondary | ICD-10-CM | POA: Diagnosis not present

## 2022-05-02 DIAGNOSIS — Z780 Asymptomatic menopausal state: Secondary | ICD-10-CM | POA: Diagnosis not present

## 2022-05-02 DIAGNOSIS — M85851 Other specified disorders of bone density and structure, right thigh: Secondary | ICD-10-CM | POA: Diagnosis not present

## 2022-05-02 DIAGNOSIS — Z1231 Encounter for screening mammogram for malignant neoplasm of breast: Secondary | ICD-10-CM | POA: Diagnosis not present

## 2022-05-02 DIAGNOSIS — M810 Age-related osteoporosis without current pathological fracture: Secondary | ICD-10-CM | POA: Diagnosis not present

## 2022-05-08 DIAGNOSIS — R945 Abnormal results of liver function studies: Secondary | ICD-10-CM | POA: Diagnosis not present

## 2022-05-08 DIAGNOSIS — M810 Age-related osteoporosis without current pathological fracture: Secondary | ICD-10-CM | POA: Diagnosis not present

## 2022-05-08 DIAGNOSIS — J449 Chronic obstructive pulmonary disease, unspecified: Secondary | ICD-10-CM | POA: Diagnosis not present

## 2022-05-31 DIAGNOSIS — N6489 Other specified disorders of breast: Secondary | ICD-10-CM | POA: Diagnosis not present

## 2022-05-31 DIAGNOSIS — R928 Other abnormal and inconclusive findings on diagnostic imaging of breast: Secondary | ICD-10-CM | POA: Diagnosis not present

## 2022-07-07 DIAGNOSIS — J439 Emphysema, unspecified: Secondary | ICD-10-CM | POA: Diagnosis not present

## 2022-07-07 DIAGNOSIS — I70221 Atherosclerosis of native arteries of extremities with rest pain, right leg: Secondary | ICD-10-CM | POA: Diagnosis not present

## 2022-07-07 DIAGNOSIS — M810 Age-related osteoporosis without current pathological fracture: Secondary | ICD-10-CM | POA: Diagnosis not present

## 2022-09-18 ENCOUNTER — Other Ambulatory Visit: Payer: Self-pay | Admitting: Vascular Surgery

## 2023-04-05 DIAGNOSIS — M810 Age-related osteoporosis without current pathological fracture: Secondary | ICD-10-CM | POA: Diagnosis not present

## 2023-04-05 DIAGNOSIS — I779 Disorder of arteries and arterioles, unspecified: Secondary | ICD-10-CM | POA: Diagnosis not present

## 2023-04-05 DIAGNOSIS — R809 Proteinuria, unspecified: Secondary | ICD-10-CM | POA: Diagnosis not present

## 2023-04-05 DIAGNOSIS — E7800 Pure hypercholesterolemia, unspecified: Secondary | ICD-10-CM | POA: Diagnosis not present

## 2023-04-09 DIAGNOSIS — Z1211 Encounter for screening for malignant neoplasm of colon: Secondary | ICD-10-CM | POA: Diagnosis not present

## 2023-04-09 DIAGNOSIS — Z23 Encounter for immunization: Secondary | ICD-10-CM | POA: Diagnosis not present

## 2023-04-09 DIAGNOSIS — I779 Disorder of arteries and arterioles, unspecified: Secondary | ICD-10-CM | POA: Diagnosis not present

## 2023-04-09 DIAGNOSIS — I499 Cardiac arrhythmia, unspecified: Secondary | ICD-10-CM | POA: Diagnosis not present

## 2023-04-09 DIAGNOSIS — Z0000 Encounter for general adult medical examination without abnormal findings: Secondary | ICD-10-CM | POA: Diagnosis not present

## 2023-04-09 DIAGNOSIS — J449 Chronic obstructive pulmonary disease, unspecified: Secondary | ICD-10-CM | POA: Diagnosis not present

## 2023-04-09 DIAGNOSIS — I70221 Atherosclerosis of native arteries of extremities with rest pain, right leg: Secondary | ICD-10-CM | POA: Diagnosis not present

## 2023-04-10 ENCOUNTER — Other Ambulatory Visit: Payer: Self-pay | Admitting: Family Medicine

## 2023-04-10 DIAGNOSIS — Z87891 Personal history of nicotine dependence: Secondary | ICD-10-CM

## 2023-08-03 NOTE — Progress Notes (Signed)
 Cardiology Office Note:   Date:  08/08/2023  ID:  [name], DOB Nov 14, 1950, MRN [id number] PCP:  Irven Coe, MD  [facility] Providers Cardiologist:  Alverda Skeans, MD Referring MD: Irven Coe, MD  Chief Complaint/Reason for Referral: Follow-up atrial fibrillation ASSESSMENT:    1. Paroxysmal atrial fibrillation (HCC)   2. Secondary hypercoagulable state (HCC)   3. PAD (peripheral artery disease) (HCC)   4. Hyperlipidemia LDL goal <55   5. Aortic atherosclerosis (HCC)   6. CKD (chronic kidney disease) stage 2, GFR 60-89 ml/min   7. BMI 40.0-44.9, adult (HCC)   8. Primary hypertension     PLAN:   In order of problems listed above: Paroxysmal atrial fibrillation: Continue Eliquis 5 mg twice daily.  Obtain echocardiogram. Secondary hypercoagulable state: Continue Eliquis 5 mg twice daily. PAD: Stop aspirin 81 mg, continue Eliquis 5 mg twice daily, continue rosuvastatin 10 mg daily, pursue strict blood pressure control. Hyperlipidemia: By virtue of the patient's peripheral arterial disease her LDL goal is less than 55.  Her LDL in December was 141.  Will increase Crestor to 20 mg and check lipid panel, LFTs, LP(a) in 2 months. Aortic atherosclerosis: Continue Eliquis 5 mg twice daily and Crestor increasing to 20 mg.  Strict blood pressure control. CKD stage II: Control blood pressure for now could consider ARB in the future. Elevated BMI: Patient had a hemoglobin A1c recently which was 5.5.  Continue diet and exercise modification. Hypertension: Start Micardis 40 mg and check BMP next week.            Dispo:  Return in about 6 months (around 02/07/2024).      Medication Adjustments/Labs and Tests Ordered: Current medicines are reviewed at length with the patient today.  Concerns regarding medicines are outlined above.  The following changes have been made:     Labs/tests ordered: Orders Placed This Encounter  Procedures   Lipoprotein A (LPA)   Lipid panel    Hepatic function panel   Basic metabolic panel with GFR   EKG 52-WUXL   ECHOCARDIOGRAM COMPLETE    Medication Changes: Meds ordered this encounter  Medications   telmisartan (MICARDIS) 40 MG tablet    Sig: Take 1 tablet (40 mg total) by mouth daily.    Dispense:  30 tablet    Refill:  3   ELIQUIS 5 MG TABS tablet    Sig: Take 1 tablet (5 mg total) by mouth 2 (two) times daily.    Dispense:  180 tablet    Refill:  3   rosuvastatin (CRESTOR) 20 MG tablet    Sig: Take 1 tablet (20 mg total) by mouth daily.    Dispense:  90 tablet    Refill:  3    Increased to 20 mg    Current medicines are reviewed at length with the patient today.  The patient does not have concerns regarding medicines.     History of Present Illness:      FOCUSED PROBLEM LIST:   PAF On Eliquis CV 2 score 2 PAD Occluded distal right SFA >> med rx Followed by Dr. Randie Heinz Hyperlipidemia Aortic atherosclerosis Chest x-ray 2019 COPD CKD stage II BMI 41  April 2025:  Patient consents to use of AI scribe. The patient is 73 year old female referred for recommendation for atrial fibrillation.  The patient was seen by her primary care provider.  At that point in time she was doing well.  She is also been seen by vascular surgery recently.  She underwent peripheral angiography which demonstrated occluded distal right SFA with reconstitution below the knee.  Her infrapopliteal arteries were not suitable for bypass.  She was treated medically.  When she was seen in vascular surgery follow-up her lower extremity symptoms were improved.  No palpitations or chest pain. Occasional lightheadedness, particularly during activities, but no episodes of syncope. No significant bleeding while on Eliquis.  Her breathing is sometimes affected when walking, attributed to her COPD. She experiences wheezing, severe enough to wake her from sleep. She has a history of smoking but quit several years ago and is under the care of a  pulmonologist for her COPD.  She denies any swelling in her legs but mentions occasional soreness. Vascular surgeons had considered intervention for her legs, but her symptoms have improved since then.          Current Medications: Current Meds  Medication Sig   albuterol (VENTOLIN HFA) 108 (90 Base) MCG/ACT inhaler Inhale 1-2 puffs into the lungs every 6 (six) hours as needed for wheezing or shortness of breath.   alendronate (FOSAMAX) 70 MG tablet Take 70 mg by mouth once a week.   Bacitracin-Polymyxin B (NEOSPORIN EX) Apply 1 Application topically daily as needed (wound care).   Bioflavonoid Products (VITAMIN C) CHEW Chew 1 tablet by mouth daily.   BREZTRI AEROSPHERE 160-9-4.8 MCG/ACT AERO Inhale 2 puffs into the lungs 2 (two) times daily.   Calcium Carbonate-Vitamin D (CALTRATE 600+D PO) Take 1 tablet by mouth daily.   guaiFENesin (MUCINEX) 600 MG 12 hr tablet Take 600 mg by mouth daily as needed (congestion).   ibuprofen (ADVIL) 200 MG tablet Take 200 mg by mouth every 6 (six) hours as needed for moderate pain.   Melatonin 10 MG TABS Take 20 mg by mouth at bedtime.   Misc Natural Products (OSTEO BI-FLEX ADV TRIPLE ST PO) Take 1 tablet by mouth daily.   Multiple Vitamin (MULTIVITAMIN WITH MINERALS) TABS tablet Take 1 tablet by mouth daily. CENTRUM SILVER FOR WOMEN 50+   telmisartan (MICARDIS) 40 MG tablet Take 1 tablet (40 mg total) by mouth daily.   Vitamin D, Ergocalciferol, (DRISDOL) 1.25 MG (50000 UNIT) CAPS capsule Take 50,000 Units by mouth once a week.   [DISCONTINUED] aspirin EC 81 MG tablet Take 81 mg by mouth daily.   [DISCONTINUED] ELIQUIS 5 MG TABS tablet Take 5 mg by mouth 2 (two) times daily.   [DISCONTINUED] rosuvastatin (CRESTOR) 10 MG tablet TAKE 1 TABLET(10 MG) BY MOUTH DAILY     Review of Systems:   Please see the history of present illness.    All other systems reviewed and are negative.     EKGs/Labs/Other Test Reviewed:   EKG: EKG from 2023 demonstrates  normal sinus rhythm  EKG Interpretation Date/Time:  Wednesday August 08 2023 14:08:03 EDT Ventricular Rate:  82 PR Interval:    QRS Duration:  96 QT Interval:  348 QTC Calculation: 406 R Axis:   95  Text Interpretation: Atrial fibrillation with premature ventricular or aberrantly conducted complexes Rightward axis Nonspecific ST and T wave abnormality When compared with ECG of 26-Dec-2021 11:42, Atrial fibrillation has replaced Sinus rhythm Nonspecific T wave abnormality now evident in Lateral leads Confirmed by Alverda Skeans ([id number]) on 08/08/2023 2:09:09 PM         Risk Assessment/Calculations:    CHA2DS2-VASc Score = 2   This indicates a 2.2% annual risk of stroke. The patient's score is based upon: CHF History: 0 HTN History: 0 Diabetes History: 0  Stroke History: 0 Vascular Disease History: 0 Age Score: 1 Gender Score: 1          Physical Exam:   VS:  BP (!) 135/92   Pulse 82   Ht 5\' 6"  (1.676 m)   Wt 240 lb (108.9 kg)   SpO2 94%   BMI 38.74 kg/m    HYPERTENSION CONTROL Vitals:   08/08/23 1405 08/08/23 1427  BP: (!) 134/96 (!) 135/92    The patient's blood pressure is elevated above target today.  In order to address the patient's elevated BP: A new medication was prescribed today.      Wt Readings from Last 3 Encounters:  08/08/23 240 lb (108.9 kg)  02/22/22 230 lb (104.3 kg)  12/26/21 226 lb (102.5 kg)      GENERAL:  No apparent distress, AOx3 HEENT:  No carotid bruits, +2 carotid impulses, no scleral icterus CAR: Irregular RR no murmurs, gallops, rubs, or thrills RES:  Clear to auscultation bilaterally ABD:  Soft, nontender, nondistended, positive bowel sounds x 4 VASC:  +2 radial pulses, +2 carotid pulses NEURO:  CN 2-12 grossly intact; motor and sensory grossly intact PSYCH:  No active depression or anxiety EXT:  No edema, ecchymosis, or cyanosis  Signed, Orbie Pyo, MD  08/08/2023 2:37 PM    [facility] [address] [place], [place], [place]  [address] Phone: [phone]; Fax: [phone]   Note:  This document was prepared using Dragon voice recognition software and may include unintentional dictation errors.

## 2023-08-08 ENCOUNTER — Ambulatory Visit: Attending: Internal Medicine | Admitting: Internal Medicine

## 2023-08-08 ENCOUNTER — Encounter: Payer: Self-pay | Admitting: Internal Medicine

## 2023-08-08 VITALS — BP 135/92 | HR 82 | Ht 66.0 in | Wt 240.0 lb

## 2023-08-08 DIAGNOSIS — I10 Essential (primary) hypertension: Secondary | ICD-10-CM

## 2023-08-08 DIAGNOSIS — N182 Chronic kidney disease, stage 2 (mild): Secondary | ICD-10-CM

## 2023-08-08 DIAGNOSIS — I480 Paroxysmal atrial fibrillation: Secondary | ICD-10-CM

## 2023-08-08 DIAGNOSIS — D6869 Other thrombophilia: Secondary | ICD-10-CM | POA: Diagnosis not present

## 2023-08-08 DIAGNOSIS — E785 Hyperlipidemia, unspecified: Secondary | ICD-10-CM

## 2023-08-08 DIAGNOSIS — Z6841 Body Mass Index (BMI) 40.0 and over, adult: Secondary | ICD-10-CM

## 2023-08-08 DIAGNOSIS — I700 Atherosclerosis of aorta: Secondary | ICD-10-CM

## 2023-08-08 DIAGNOSIS — I739 Peripheral vascular disease, unspecified: Secondary | ICD-10-CM

## 2023-08-08 MED ORDER — ELIQUIS 5 MG PO TABS: 5.0000 mg | ORAL_TABLET | Freq: Two times a day (BID) | ORAL | 3 refills | Status: AC

## 2023-08-08 MED ORDER — ROSUVASTATIN CALCIUM 20 MG PO TABS: 20.0000 mg | ORAL_TABLET | Freq: Every day | ORAL | 3 refills | Status: AC

## 2023-08-08 MED ORDER — TELMISARTAN 40 MG PO TABS: 40.0000 mg | ORAL_TABLET | Freq: Every day | ORAL | 3 refills | Status: AC

## 2023-08-08 NOTE — Patient Instructions (Addendum)
 Medication Instructions:  Your physician has recommended you make the following change in your medication:   STOP Aspirin  START Telmisartan (Micardis) 40 mg once daily   INCREASE Rosuvastatin (Crestor) to 20 mg once daily    Refill for Eliquis has been sent in to your pharmacy.  *If you need a refill on your cardiac medications before your next appointment, please call your pharmacy*  Lab Work: To be completed in 1 week: BMP (approximately August 15, 2023)  To be completed in 2 months: FASTING lipid panel, LFTs, and lipoprotein-A (approximately October 08, 2023)  If you have labs (blood work) drawn today and your tests are completely normal, you will receive your results only by: MyChart Message (if you have MyChart) OR A paper copy in the mail If you have any lab test that is abnormal or we need to change your treatment, we will call you to review the results.  Testing/Procedures: Your physician has requested that you have an echocardiogram. Echocardiography is a painless test that uses sound waves to create images of your heart. It provides your doctor with information about the size and shape of your heart and how well your heart's chambers and valves are working. This procedure takes approximately one hour. There are no restrictions for this procedure. Please do NOT wear cologne, perfume, aftershave, or lotions (deodorant is allowed). Please arrive 15 minutes prior to your appointment time.  Please note: We ask at that you not bring children with you during ultrasound (echo/ vascular) testing. Due to room size and safety concerns, children are not allowed in the ultrasound rooms during exams. Our front office staff cannot provide observation of children in our lobby area while testing is being conducted. An adult accompanying a patient to their appointment will only be allowed in the ultrasound room at the discretion of the ultrasound technician under special circumstances. We apologize  for any inconvenience.   Follow-Up: At [facility], you and your health needs are our priority.  As part of our continuing mission to provide you with exceptional heart care, we have created designated Provider Care Teams.  These Care Teams include your primary Cardiologist (physician) and Advanced Practice Providers (APPs -  Physician Assistants and Nurse Practitioners) who all work together to provide you with the care you need, when you need it.  We recommend signing up for the patient portal called "MyChart".  Sign up information is provided on this After Visit Summary.  MyChart is used to connect with patients for Virtual Visits (Telemedicine).  Patients are able to view lab/test results, encounter notes, upcoming appointments, etc.  Non-urgent messages can be sent to your provider as well.   To learn more about what you can do with MyChart, go to [email or web address].    Your next appointment:   6 month(s)  The format for your next appointment:   In Person  Provider:   One of our Advanced Practice Providers (APPs): Arabella Merles, PA-C  Joni Reining, NP Edd Fabian, NP  Jacolyn Reedy, PA-C Jari Favre, PA-C  [place], PA-C Robin Searing, NP  Azalee Course, PA-C [place], PA-C  Bernadene Person, NP    Ronie Spies, PA-C  Evlyn Clines, PA-C Rise Paganini, NP Tereso Newcomer, PA-C Marjie Skiff, PA-C  [place], NP Yvonna Alanis, PA-C  Perlie Gold, PA-C Robet Leu, PA-C Cyndi Bender, NP   Other Instructions   1st Floor: - Lobby - Registration  - Pharmacy  - Lab - Cafe  2nd Floor: - PV  Lab - Diagnostic Testing (echo, CT, nuclear med)  3rd Floor: - Vacant  4th Floor: - TCTS (cardiothoracic surgery) - AFib Clinic - Structural Heart Clinic - Vascular Surgery  - Vascular Ultrasound  5th Floor: - HeartCare Cardiology (general and EP) - Clinical Pharmacy for coumadin, hypertension, lipid, weight-loss medications, and med management  appointments    Valet parking services will be available as well.

## 2023-08-15 DIAGNOSIS — E785 Hyperlipidemia, unspecified: Secondary | ICD-10-CM | POA: Diagnosis not present

## 2023-08-15 DIAGNOSIS — I10 Essential (primary) hypertension: Secondary | ICD-10-CM | POA: Diagnosis not present

## 2023-08-16 LAB — LIPID PANEL
Chol/HDL Ratio: 2.7 ratio (ref 0.0–4.4)
Cholesterol, Total: 157 mg/dL (ref 100–199)
HDL: 59 mg/dL (ref >39)
LDL Chol Calc (NIH): 66 mg/dL (ref 0–99)
Triglycerides: 194 mg/dL — ABNORMAL HIGH (ref 0–149)
VLDL Cholesterol Cal: 32 mg/dL (ref 5–40)

## 2023-08-16 LAB — BASIC METABOLIC PANEL WITH GFR
BUN/Creatinine Ratio: 18 (ref 12–28)
BUN: 16 mg/dL (ref 8–27)
CO2: 18 mmol/L — ABNORMAL LOW (ref 20–29)
Calcium: 9.5 mg/dL (ref 8.7–10.3)
Chloride: 102 mmol/L (ref 96–106)
Creatinine, Ser: 0.89 mg/dL (ref 0.57–1.00)
Glucose: 84 mg/dL (ref 70–99)
Potassium: 5 mmol/L (ref 3.5–5.2)
Sodium: 141 mmol/L (ref 134–144)
eGFR: 68 mL/min/{1.73_m2} (ref >59)

## 2023-08-16 LAB — HEPATIC FUNCTION PANEL
ALT: 17 IU/L (ref 0–32)
AST: 22 IU/L (ref 0–40)
Albumin: 4.2 g/dL (ref 3.8–4.8)
Alkaline Phosphatase: 85 IU/L (ref 44–121)
Bilirubin Total: 0.5 mg/dL (ref 0.0–1.2)
Bilirubin, Direct: 0.15 mg/dL (ref 0.00–0.40)
Total Protein: 7.4 g/dL (ref 6.0–8.5)

## 2023-08-16 LAB — LIPOPROTEIN A (LPA): Lipoprotein (a): <8.4 nmol/L (ref <75.0)

## 2023-08-17 ENCOUNTER — Encounter: Payer: Self-pay | Admitting: Internal Medicine

## 2023-08-22 ENCOUNTER — Other Ambulatory Visit: Payer: Self-pay | Admitting: *Deleted

## 2023-08-22 DIAGNOSIS — E785 Hyperlipidemia, unspecified: Secondary | ICD-10-CM

## 2023-08-22 NOTE — Progress Notes (Unsigned)
 Per Dr. Lorie Rook, patient just increased Crestor  to 20 mg last week.  Plan to recheck lipids, liver function in 2 months.  Lab orders placed.

## 2023-09-12 ENCOUNTER — Ambulatory Visit (HOSPITAL_COMMUNITY): Attending: Cardiology

## 2023-09-12 ENCOUNTER — Ambulatory Visit: Payer: Self-pay | Admitting: Internal Medicine

## 2023-09-12 DIAGNOSIS — I480 Paroxysmal atrial fibrillation: Secondary | ICD-10-CM | POA: Insufficient documentation

## 2023-09-12 LAB — ECHOCARDIOGRAM COMPLETE: S' Lateral: 3.5 cm

## 2023-10-08 DIAGNOSIS — E785 Hyperlipidemia, unspecified: Secondary | ICD-10-CM | POA: Diagnosis not present

## 2023-10-08 DIAGNOSIS — J449 Chronic obstructive pulmonary disease, unspecified: Secondary | ICD-10-CM | POA: Diagnosis not present

## 2023-10-08 DIAGNOSIS — M810 Age-related osteoporosis without current pathological fracture: Secondary | ICD-10-CM | POA: Diagnosis not present

## 2023-10-08 DIAGNOSIS — E7800 Pure hypercholesterolemia, unspecified: Secondary | ICD-10-CM | POA: Diagnosis not present

## 2023-10-08 LAB — LIPID PANEL

## 2023-10-09 ENCOUNTER — Ambulatory Visit: Payer: Self-pay | Admitting: Internal Medicine

## 2023-10-09 DIAGNOSIS — M8588 Other specified disorders of bone density and structure, other site: Secondary | ICD-10-CM | POA: Diagnosis not present

## 2023-10-09 DIAGNOSIS — E7800 Pure hypercholesterolemia, unspecified: Secondary | ICD-10-CM | POA: Diagnosis not present

## 2023-10-09 DIAGNOSIS — J449 Chronic obstructive pulmonary disease, unspecified: Secondary | ICD-10-CM | POA: Diagnosis not present

## 2023-10-09 DIAGNOSIS — M810 Age-related osteoporosis without current pathological fracture: Secondary | ICD-10-CM | POA: Diagnosis not present

## 2023-10-09 LAB — HEPATIC FUNCTION PANEL
ALT: 18 IU/L (ref 0–32)
AST: 20 IU/L (ref 0–40)
Albumin: 4.1 g/dL (ref 3.8–4.8)
Alkaline Phosphatase: 89 IU/L (ref 44–121)
Bilirubin Total: 0.4 mg/dL (ref 0.0–1.2)
Bilirubin, Direct: 0.16 mg/dL (ref 0.00–0.40)
Total Protein: 6.9 g/dL (ref 6.0–8.5)

## 2023-10-09 LAB — LIPID PANEL
Cholesterol, Total: 141 mg/dL (ref 100–199)
HDL: 51 mg/dL (ref >39)
LDL CALC COMMENT:: 2.8 ratio (ref 0.0–4.4)
LDL Chol Calc (NIH): 59 mg/dL (ref 0–99)
Triglycerides: 191 mg/dL — ABNORMAL HIGH (ref 0–149)
VLDL Cholesterol Cal: 31 mg/dL (ref 5–40)

## 2024-04-10 DIAGNOSIS — J449 Chronic obstructive pulmonary disease, unspecified: Secondary | ICD-10-CM | POA: Diagnosis not present

## 2024-04-10 DIAGNOSIS — M810 Age-related osteoporosis without current pathological fracture: Secondary | ICD-10-CM | POA: Diagnosis not present

## 2024-04-10 DIAGNOSIS — R809 Proteinuria, unspecified: Secondary | ICD-10-CM | POA: Diagnosis not present

## 2024-04-10 DIAGNOSIS — Z23 Encounter for immunization: Secondary | ICD-10-CM | POA: Diagnosis not present

## 2024-04-10 DIAGNOSIS — E7800 Pure hypercholesterolemia, unspecified: Secondary | ICD-10-CM | POA: Diagnosis not present

## 2024-04-10 DIAGNOSIS — Z0000 Encounter for general adult medical examination without abnormal findings: Secondary | ICD-10-CM | POA: Diagnosis not present
